# Patient Record
Sex: Male | Born: 1965
Health system: Southern US, Community
[De-identification: ages and names within clinical notes are randomized; demographics above are authoritative.]

## PROBLEM LIST (undated history)

## (undated) DIAGNOSIS — E785 Hyperlipidemia, unspecified: Secondary | ICD-10-CM

## (undated) DIAGNOSIS — I209 Angina pectoris, unspecified: Secondary | ICD-10-CM

## (undated) DIAGNOSIS — E876 Hypokalemia: Secondary | ICD-10-CM

## (undated) DIAGNOSIS — N183 Chronic kidney disease, stage 3 unspecified: Secondary | ICD-10-CM

## (undated) DIAGNOSIS — D563 Thalassemia minor: Secondary | ICD-10-CM

## (undated) DIAGNOSIS — I10 Essential (primary) hypertension: Secondary | ICD-10-CM

## (undated) DIAGNOSIS — G43909 Migraine, unspecified, not intractable, without status migrainosus: Secondary | ICD-10-CM

## (undated) DIAGNOSIS — N189 Chronic kidney disease, unspecified: Secondary | ICD-10-CM

## (undated) DIAGNOSIS — K219 Gastro-esophageal reflux disease without esophagitis: Secondary | ICD-10-CM

## (undated) DIAGNOSIS — N289 Disorder of kidney and ureter, unspecified: Secondary | ICD-10-CM

## (undated) DIAGNOSIS — R001 Bradycardia, unspecified: Secondary | ICD-10-CM

## (undated) DIAGNOSIS — R0789 Other chest pain: Secondary | ICD-10-CM

## (undated) DIAGNOSIS — G4730 Sleep apnea, unspecified: Secondary | ICD-10-CM

## (undated) HISTORY — DX: Hyperlipidemia, unspecified: E78.5

## (undated) HISTORY — PX: WISDOM TOOTH EXTRACTION: SHX21

## (undated) HISTORY — PX: VASECTOMY: SHX75

## (undated) HISTORY — PX: HERNIA REPAIR: SHX51

## (undated) HISTORY — DX: Chronic kidney disease, unspecified: N18.9

---

## 2004-05-28 HISTORY — PX: COLONOSCOPY: SHX174

## 2004-10-12 ENCOUNTER — Ambulatory Visit (HOSPITAL_COMMUNITY): Admission: RE | Admit: 2004-10-12 | Discharge: 2004-10-12 | Payer: Self-pay | Admitting: Internal Medicine

## 2004-10-12 ENCOUNTER — Ambulatory Visit: Payer: Self-pay | Admitting: Internal Medicine

## 2004-10-30 ENCOUNTER — Ambulatory Visit: Payer: Self-pay | Admitting: Internal Medicine

## 2004-11-03 ENCOUNTER — Ambulatory Visit (HOSPITAL_COMMUNITY): Admission: RE | Admit: 2004-11-03 | Discharge: 2004-11-03 | Payer: Self-pay | Admitting: Internal Medicine

## 2004-11-08 ENCOUNTER — Ambulatory Visit: Payer: Self-pay | Admitting: Internal Medicine

## 2007-02-21 ENCOUNTER — Ambulatory Visit (HOSPITAL_COMMUNITY): Admission: RE | Admit: 2007-02-21 | Discharge: 2007-02-21 | Payer: Self-pay | Admitting: Ophthalmology

## 2009-08-16 ENCOUNTER — Ambulatory Visit: Payer: Self-pay | Admitting: Orthopedic Surgery

## 2009-08-16 DIAGNOSIS — S63659A Sprain of metacarpophalangeal joint of unspecified finger, initial encounter: Secondary | ICD-10-CM | POA: Insufficient documentation

## 2010-06-27 NOTE — Letter (Signed)
Summary: Historic Patient File  Historic Patient File   Imported By: Elvera Maria 08/26/2009 09:56:16  _____________________________________________________________________  External Attachment:    Type:   Image     Comment:   history form

## 2010-06-27 NOTE — Assessment & Plan Note (Signed)
Summary: left thumb inury needs xr/[employer]/bsf   Vital Signs:  Patient profile:   45 year old male Height:      69 inches Weight:      178 pounds Pulse rate:   68 / minute Resp:     18 per minute  Vitals Entered By: Fuller Canada MD (August 16, 2009 3:05 PM)  Visit Type:  initial visit Referring Provider:  self Primary Provider:  Dr. Sherwood Gambler  CC:  left thumb pain.  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  DOI 08/14/09.  Meds: Benicar, Bystolic, Norvasc, Benadryl.    Allergies (verified): 1)  ! 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 Cardiovascular:  Denies chest pain, palpitations, fainting, and murmurs. Respiratory:  Denies short of breath, wheezing, couch, tightness, pain on inspiration, and snoring . Gastrointestinal:  Denies heartburn, nausea, vomiting, diarrhea, constipation, and blood in your stools. Genitourinary:  Denies frequency, urgency, difficulty urinating, painful urination, flank pain, and bleeding in urine. Neurologic:  Denies numbness, tingling, unsteady gait, dizziness, tremors, and seizure. Musculoskeletal:  Denies joint pain, swelling, instability, stiffness, redness, heat, and muscle pain. Endocrine:  Denies excessive thirst, exessive urination, and heat or cold intolerance. Psychiatric:  Denies nervousness, depression, anxiety, and hallucinations. Skin:  Denies changes in the skin, poor healing, rash, itching, and redness. HEENT:  Denies blurred or  double vision, eye pain, redness, and watering. Immunology:  Denies seasonal allergies, sinus problems, and allergic to bee stings; allergic to onions. Hemoatologic:  Denies easy bleeding and brusing.  Physical Exam  Additional Exam:  well dressed well groomed with vitals stable  Normal temperature of the digit swelling over the metacarpophalangeal joint firm endpoint on valgus stress in flexion and extension skin is intact normal sensation and perfusion to the hand.  His range of motion is somewhat limited but nearly full.  No weakness.     Impression & Recommendations:  Problem # 1:  SPRAIN AND STRAIN OF METACARPOPHALANGEAL OF HAND (ICD-842.12) Assessment New  AP lateral oblique LEFT hand  No fracture dislocation seen mild soft tissue swelling around the thumb  Impression soft tissue swelling consistent with a sprain no acute fracture  Orders: New Patient Level II (16109) Hand x-ray, minimum 3 views (73130)  Patient Instructions: 1)  wear brace for 3 weeks  2)  recheck in 3 weeks

## 2010-10-13 NOTE — Op Note (Signed)
Devin Wilson, Devin Wilson               ACCOUNT NO.:  [id number]   MEDICAL RECORD NO.:  [id number]          PATIENT TYPE:  AMB   LOCATION:  DAY                           FACILITY:  [facility]   PHYSICIAN:  R. Roetta Sessions, M.D. DATE OF BIRTH:  1965/10/15   DATE OF PROCEDURE:  10/12/2004  DATE OF DISCHARGE:                                 OPERATIVE REPORT   PROCEDURE:  Diagnostic esophagogastroduodenoscopy.   ENDOSCOPIST:  Jonathon Bellows, M.D.   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   She really has no chronic symptoms of gastroesophageal reflux disease or  esophageal dysphagia.  Has not really had any GI illnesses previously.  He  takes Celebrex for osteoarthritis, as well as Norvasc and Hyzaar for  hypertension.  He occasionally takes Benadryl.  He has not had any fever or  chills.  He does not recall any symptoms to go with pill impaction.  He  consumes adequate fluids when he swallows his medications, per his report.  There is no family history of chronic GI illness.  EGD is now being done to  further evaluate his symptoms.  This procedure has been discussed in regard  to length, potential risks, benefits, and alternatives have been reviewed,  and questions answered.  He is agreeable to proceed.  Please see  documentation in medical record.   PROCEDURE NOTE:  Oxygen saturation, blood pressure, pulses, and respirations  were monitored throughout the entire procedure.  Conscious sedation with  Versed 2 mg intravenously, and Demerol 50 mg intravenously in divided doses.   INSTRUMENT:  Olympus video system.   FINDINGS:  Hypopharynx appeared normal.  The upper esophageal sphincter was  easily traversed.  Examination of the tubular esophagus revealed pristine  mucosa.  The esophageal mucosa was well seen all the way down to the EG  junction.  There was no  evidence of inflammatory process.  Specifically, no  evidence of erosion, ulceration, or plaquing.  In addition, there was no  ringed appearance to the tubular esophagus, either to suggest eosinophilic  esophagitis.  The EG junction was easily traversed.  In the stomach, the  gastric cavity was empty and insufflated well.  Examination of the gastric  mucosa retroflexed view was undertaken.  The EG junction was well seen.  On  retroflexion, except for a single linear antral erosion, the gastric mucosa  appeared entirely normal.  The pylorus was patent and easily traversed.  Examination of the second portion revealed no abnormalities.   THERAPIES/DIAGNOSTIC MANEUVERS PERFORMED:  None.   DISPOSITION:  The patient tolerated the procedure well.   IMPRESSION:  1.  Normal-appearing esophagus/esophageal mucosa.  2.  Single linear antral erosion, otherwise normal stomach, normal D1, and      D2.   RECOMMENDATIONS:  1.  We will go ahead and check a gallbladder ultrasound for completion along      with LFT's, lipase, and CBC.  2.  A course of Prevacid 30 mg Solu-Tabs 1 each  morning.  3.  Carafate slurry 1 gram q.i.d. for the next 5 days.  4.  If the above labs and ultrasound are negative and he does not respond to      the above-mentioned treatment, we will consider a course of orally      administered inhalant steroid therapy for empiric treatment of      eosinophilic esophagitis.  5.  Further recommendations to follow.      RMR/MEDQ  D:  10/12/2004  T:  10/12/2004  Job:  [id number]

## 2010-10-13 NOTE — Op Note (Signed)
NAMEAPRIL, Devin Wilson               ACCOUNT NO.:  [id number]   MEDICAL RECORD NO.:  [id number]          PATIENT TYPE:  AMB   LOCATION:  DAY                           FACILITY:  [facility]   PHYSICIAN:  R. Roetta Sessions, M.D. DATE OF BIRTH:  06-22-1965   DATE OF PROCEDURE:  11/03/2004  DATE OF DISCHARGE:                                 OPERATIVE REPORT   PROCEDURES:  Diagnostic colonoscopy.   INDICATIONS FOR PROCEDURE:  The patient is a 45 year old gentleman who was  recently evaluated for odynophagia.  EGD revealed an antral erosion.  Iron  studies demonstrated a borderline iron deficiency anemia (he does have a  history of beta thalassemia).  The H&H was 11.9 and 34.8 and MCV 70.9.   Three out of three Hemoccult cards came back positive.  His odynophagia  resolved with a course of Carafate and Prevacid 30 mg orally daily.  Consequentially he comes for colonoscopy.  He is not having any lower GI  symptoms.  He has never had a colonoscopy.  There is family history of  colorectal neoplasia.  Colonoscopy is now being done.  The potential risks,  benefits and alternatives have been reviewed and questions answered.  See  documentation in the medical record.   PROCEDURE NOTE:  O2 saturation, blood pressure, pulse and respirations were  monitored throughout the entire procedure.  Conscious sedation with Versed 3  mg IV and Demerol 50 mg IV in divided doses.   INSTRUMENT:  Olympus video chip system.   FINDINGS:  The digital rectal exam revealed no abnormalities.   ENDOSCOPIC FINDINGS:  Prep was excellent.   Rectum:  Examination of the rectal mucosa, including retroflexed view of the  anal verge revealed minimal internal hemorrhoids.  Otherwise the rectal  mucosa appeared normal.   Colon:  The colonic mucosa was surveyed from the rectosigmoid junction  through the left transverse and right colon to the appendiceal orifice and  ileocecal valve.  These structures were well seen and  photographed for the  record.  The terminal ileum was intubated to 10 cm.  From this level, the  scope was slowly withdrawn.  All previously mentioned mucosal surfaces were  again seen.  The colonic mucosa appeared entirely normal.  The terminal  ileal mucosa appeared normal.  The patient tolerated the procedure well and  was reacted at endoscopy.   IMPRESSION:  Minimal internal hemorrhoids, otherwise normal rectum and  terminal ileum.   Today's findings along with the EGD are reassuring.  He is asymptomatic from  a GI standpoint.  I have asked him to continue prevacid 30 mg orally daily  for another month.  Will recheck a CBC in six weeks.       RMR/MEDQ  D:  11/03/2004  T:  11/04/2004  Job:  [id number]

## 2010-12-15 ENCOUNTER — Other Ambulatory Visit: Payer: Self-pay | Admitting: Neurology

## 2010-12-15 ENCOUNTER — Ambulatory Visit (HOSPITAL_COMMUNITY)
Admission: RE | Admit: 2010-12-15 | Discharge: 2010-12-15 | Disposition: A | Discharge status: Home / Self Care | Payer: Managed Care, Other (non HMO) | Source: Ambulatory Visit | Attending: Neurology | Admitting: Neurology

## 2010-12-15 DIAGNOSIS — M79609 Pain in unspecified limb: Secondary | ICD-10-CM | POA: Insufficient documentation

## 2010-12-15 DIAGNOSIS — R52 Pain, unspecified: Secondary | ICD-10-CM

## 2011-06-21 ENCOUNTER — Other Ambulatory Visit (HOSPITAL_COMMUNITY): Payer: Self-pay | Admitting: Family Medicine

## 2011-06-21 DIAGNOSIS — I10 Essential (primary) hypertension: Secondary | ICD-10-CM

## 2011-06-26 ENCOUNTER — Ambulatory Visit (HOSPITAL_COMMUNITY): Payer: Managed Care, Other (non HMO)

## 2011-07-13 ENCOUNTER — Ambulatory Visit (HOSPITAL_COMMUNITY): Admission: RE | Admit: 2011-07-13 | Payer: Managed Care, Other (non HMO) | Source: Ambulatory Visit

## 2011-07-13 ENCOUNTER — Ambulatory Visit (HOSPITAL_COMMUNITY): Payer: Managed Care, Other (non HMO)

## 2011-08-09 ENCOUNTER — Other Ambulatory Visit: Payer: Self-pay

## 2011-08-09 ENCOUNTER — Inpatient Hospital Stay (HOSPITAL_COMMUNITY)
Admission: EM | Admit: 2011-08-09 | Discharge: 2011-08-11 | DRG: 287 | Disposition: A | Discharge status: Home / Self Care | Payer: Managed Care, Other (non HMO) | Attending: Internal Medicine | Admitting: Internal Medicine

## 2011-08-09 DIAGNOSIS — D569 Thalassemia, unspecified: Secondary | ICD-10-CM | POA: Diagnosis present

## 2011-08-09 DIAGNOSIS — R079 Chest pain, unspecified: Principal | ICD-10-CM | POA: Diagnosis present

## 2011-08-09 DIAGNOSIS — I200 Unstable angina: Secondary | ICD-10-CM

## 2011-08-09 DIAGNOSIS — I10 Essential (primary) hypertension: Secondary | ICD-10-CM | POA: Diagnosis present

## 2011-08-09 DIAGNOSIS — E876 Hypokalemia: Secondary | ICD-10-CM | POA: Diagnosis present

## 2011-08-09 DIAGNOSIS — Z23 Encounter for immunization: Secondary | ICD-10-CM

## 2011-08-09 DIAGNOSIS — I129 Hypertensive chronic kidney disease with stage 1 through stage 4 chronic kidney disease, or unspecified chronic kidney disease: Secondary | ICD-10-CM | POA: Diagnosis present

## 2011-08-09 DIAGNOSIS — D649 Anemia, unspecified: Secondary | ICD-10-CM | POA: Diagnosis present

## 2011-08-09 DIAGNOSIS — D563 Thalassemia minor: Secondary | ICD-10-CM | POA: Insufficient documentation

## 2011-08-09 DIAGNOSIS — K219 Gastro-esophageal reflux disease without esophagitis: Secondary | ICD-10-CM | POA: Diagnosis present

## 2011-08-09 DIAGNOSIS — N189 Chronic kidney disease, unspecified: Secondary | ICD-10-CM | POA: Diagnosis present

## 2011-08-09 HISTORY — DX: Angina pectoris, unspecified: I20.9

## 2011-08-09 HISTORY — DX: Migraine, unspecified, not intractable, without status migrainosus: G43.909

## 2011-08-09 HISTORY — DX: Essential (primary) hypertension: I10

## 2011-08-09 HISTORY — DX: Gastro-esophageal reflux disease without esophagitis: K21.9

## 2011-08-09 HISTORY — DX: Bradycardia, unspecified: R00.1

## 2011-08-09 NOTE — ED Notes (Signed)
Pt with onset of aching type chest pain to left chest through to his back, states started approx 2030, no change with movement or breathing.

## 2011-08-10 ENCOUNTER — Encounter (HOSPITAL_COMMUNITY): Payer: Self-pay

## 2011-08-10 ENCOUNTER — Emergency Department (HOSPITAL_COMMUNITY): Payer: Managed Care, Other (non HMO)

## 2011-08-10 ENCOUNTER — Other Ambulatory Visit: Payer: Self-pay

## 2011-08-10 ENCOUNTER — Encounter (HOSPITAL_COMMUNITY)
Admission: EM | Disposition: A | Discharge status: Home / Self Care | Payer: Self-pay | Source: Home / Self Care | Attending: Internal Medicine

## 2011-08-10 ENCOUNTER — Ambulatory Visit (HOSPITAL_COMMUNITY): Admit: 2011-08-10 | Payer: Self-pay | Admitting: Cardiology

## 2011-08-10 DIAGNOSIS — I517 Cardiomegaly: Secondary | ICD-10-CM

## 2011-08-10 DIAGNOSIS — R079 Chest pain, unspecified: Secondary | ICD-10-CM | POA: Insufficient documentation

## 2011-08-10 DIAGNOSIS — D563 Thalassemia minor: Secondary | ICD-10-CM

## 2011-08-10 DIAGNOSIS — I200 Unstable angina: Secondary | ICD-10-CM

## 2011-08-10 DIAGNOSIS — I10 Essential (primary) hypertension: Secondary | ICD-10-CM

## 2011-08-10 HISTORY — PX: LEFT HEART CATHETERIZATION WITH CORONARY ANGIOGRAM: SHX5451

## 2011-08-10 HISTORY — PX: CARDIAC CATHETERIZATION: SHX172

## 2011-08-10 LAB — CBC
HCT: 34.6 % — ABNORMAL LOW (ref 39.0–52.0)
HCT: 36.7 % — ABNORMAL LOW (ref 39.0–52.0)
Hemoglobin: 11.1 g/dL — ABNORMAL LOW (ref 13.0–17.0)
Hemoglobin: 12.1 g/dL — ABNORMAL LOW (ref 13.0–17.0)
MCH: 23.8 pg — ABNORMAL LOW (ref 26.0–34.0)
MCV: 72.2 fL — ABNORMAL LOW (ref 78.0–100.0)
Platelets: 242 10*3/uL (ref 150–400)
RBC: 4.68 MIL/uL (ref 4.22–5.81)
RBC: 5.08 MIL/uL (ref 4.22–5.81)
RDW: 15.2 % (ref 11.5–15.5)
WBC: 4.5 10*3/uL (ref 4.0–10.5)

## 2011-08-10 LAB — BASIC METABOLIC PANEL WITH GFR
BUN: 15 mg/dL (ref 6–23)
CO2: 31 meq/L (ref 19–32)
Calcium: 9.4 mg/dL (ref 8.4–10.5)
Chloride: 100 meq/L (ref 96–112)
Creatinine, Ser: 1.48 mg/dL — ABNORMAL HIGH (ref 0.50–1.35)
GFR calc Af Amer: 64 mL/min — ABNORMAL LOW
GFR calc non Af Amer: 56 mL/min — ABNORMAL LOW
Glucose, Bld: 92 mg/dL (ref 70–99)
Potassium: 3.6 meq/L (ref 3.5–5.1)
Sodium: 137 meq/L (ref 135–145)

## 2011-08-10 LAB — LIPID PANEL
Cholesterol: 181 mg/dL (ref 0–200)
Total CHOL/HDL Ratio: 3.5 RATIO
VLDL: 13 mg/dL (ref 0–40)

## 2011-08-10 LAB — PROTIME-INR
INR: 1.06 (ref 0.00–1.49)
Prothrombin Time: 14 seconds (ref 11.6–15.2)

## 2011-08-10 LAB — CARDIAC PANEL(CRET KIN+CKTOT+MB+TROPI)
CK, MB: 5.1 ng/mL — ABNORMAL HIGH (ref 0.3–4.0)
CK, MB: 3.7 ng/mL (ref 0.3–4.0)
Total CK: 476 U/L — ABNORMAL HIGH (ref 7–232)
Troponin I: <0.3 ng/mL (ref <0.30)

## 2011-08-10 LAB — CREATININE, SERUM
GFR calc Af Amer: 77 mL/min — ABNORMAL LOW (ref >90)
GFR calc non Af Amer: 66 mL/min — ABNORMAL LOW (ref >90)

## 2011-08-10 LAB — POCT I-STAT, CHEM 8
Hemoglobin: 13.3 g/dL (ref 13.0–17.0)
Potassium: 3 mEq/L — ABNORMAL LOW (ref 3.5–5.1)
TCO2: 31 mmol/L (ref 0–100)

## 2011-08-10 LAB — POCT I-STAT TROPONIN I: Troponin i, poc: 0 ng/mL (ref 0.00–0.08)

## 2011-08-10 LAB — HEPARIN LEVEL (UNFRACTIONATED): Heparin Unfractionated: 0.95 IU/mL — ABNORMAL HIGH (ref 0.30–0.70)

## 2011-08-10 LAB — MRSA PCR SCREENING: MRSA by PCR: NEGATIVE

## 2011-08-10 SURGERY — LEFT HEART CATHETERIZATION WITH CORONARY ANGIOGRAM
Anesthesia: LOCAL

## 2011-08-10 MED ORDER — POTASSIUM CHLORIDE CRYS ER 20 MEQ PO TBCR
60.0000 meq | EXTENDED_RELEASE_TABLET | Freq: Once | ORAL | Status: AC
  Administered 2011-08-10: 60 meq via ORAL
  Filled 2011-08-10: qty 3

## 2011-08-10 MED ORDER — VERAPAMIL HCL 2.5 MG/ML IV SOLN
INTRAVENOUS | Status: AC
  Filled 2011-08-10: qty 2

## 2011-08-10 MED ORDER — HEPARIN SODIUM (PORCINE) 5000 UNIT/ML IJ SOLN
60.0000 [IU]/kg | Freq: Three times a day (TID) | INTRAMUSCULAR | Status: DC
  Administered 2011-08-10: 4750 [IU] via INTRAVENOUS
  Filled 2011-08-10 (×6): qty 0.95

## 2011-08-10 MED ORDER — FENTANYL CITRATE 0.05 MG/ML IJ SOLN
INTRAMUSCULAR | Status: AC
  Filled 2011-08-10: qty 2

## 2011-08-10 MED ORDER — NITROGLYCERIN 0.4 MG SL SUBL
0.4000 mg | SUBLINGUAL_TABLET | SUBLINGUAL | Status: DC | PRN
  Administered 2011-08-10 (×2): 0.4 mg via SUBLINGUAL
  Filled 2011-08-10: qty 25

## 2011-08-10 MED ORDER — ACETAMINOPHEN 325 MG PO TABS: 650.0000 mg | ORAL_TABLET | ORAL | Status: DC | PRN

## 2011-08-10 MED ORDER — SODIUM CHLORIDE 0.9 % IV SOLN: INTRAVENOUS | Status: AC

## 2011-08-10 MED ORDER — ONDANSETRON HCL 4 MG/2ML IJ SOLN: 4.0000 mg | Freq: Four times a day (QID) | INTRAMUSCULAR | Status: DC | PRN

## 2011-08-10 MED ORDER — SODIUM CHLORIDE 0.9 % IV SOLN
1000.0000 mL | Freq: Once | INTRAVENOUS | Status: AC
  Administered 2011-08-10: 1000 mL via INTRAVENOUS

## 2011-08-10 MED ORDER — CLONIDINE HCL 0.1 MG/24HR TD PTWK: 0.1000 mg | MEDICATED_PATCH | TRANSDERMAL | Status: DC

## 2011-08-10 MED ORDER — BIOTENE DRY MOUTH MT LIQD: 15.0000 mL | OROMUCOSAL | Status: DC | PRN

## 2011-08-10 MED ORDER — SODIUM CHLORIDE 0.9 % IV SOLN
INTRAVENOUS | Status: AC
  Administered 2011-08-10: 06:00:00 via INTRAVENOUS

## 2011-08-10 MED ORDER — HEPARIN SODIUM (PORCINE) 5000 UNIT/ML IJ SOLN
5000.0000 [IU] | Freq: Three times a day (TID) | INTRAMUSCULAR | Status: DC
  Administered 2011-08-10 – 2011-08-11 (×2): 5000 [IU] via SUBCUTANEOUS
  Filled 2011-08-10 (×5): qty 1

## 2011-08-10 MED ORDER — MORPHINE SULFATE 2 MG/ML IJ SOLN
2.0000 mg | Freq: Once | INTRAMUSCULAR | Status: AC
  Administered 2011-08-10: 2 mg via INTRAVENOUS
  Filled 2011-08-10: qty 1

## 2011-08-10 MED ORDER — HEPARIN SODIUM (PORCINE) 1000 UNIT/ML IJ SOLN
INTRAMUSCULAR | Status: AC
  Filled 2011-08-10: qty 1

## 2011-08-10 MED ORDER — LIDOCAINE HCL (PF) 1 % IJ SOLN
INTRAMUSCULAR | Status: AC
  Filled 2011-08-10: qty 30

## 2011-08-10 MED ORDER — ASPIRIN 81 MG PO CHEW
324.0000 mg | CHEWABLE_TABLET | ORAL | Status: AC
Start: 2011-08-11 — End: 2011-08-11
  Administered 2011-08-11: 324 mg via ORAL
  Filled 2011-08-10: qty 4

## 2011-08-10 MED ORDER — ACETAMINOPHEN 650 MG RE SUPP: 650.0000 mg | Freq: Four times a day (QID) | RECTAL | Status: DC | PRN

## 2011-08-10 MED ORDER — ALBUTEROL SULFATE (5 MG/ML) 0.5% IN NEBU: 2.5000 mg | INHALATION_SOLUTION | RESPIRATORY_TRACT | Status: DC | PRN

## 2011-08-10 MED ORDER — AMLODIPINE BESYLATE 5 MG PO TABS
5.0000 mg | ORAL_TABLET | Freq: Two times a day (BID) | ORAL | Status: DC
  Administered 2011-08-10 – 2011-08-11 (×3): 5 mg via ORAL
  Filled 2011-08-10 (×6): qty 1

## 2011-08-10 MED ORDER — ASPIRIN EC 325 MG PO TBEC
325.0000 mg | DELAYED_RELEASE_TABLET | Freq: Every day | ORAL | Status: DC
  Administered 2011-08-10: 325 mg via ORAL
  Filled 2011-08-10 (×2): qty 1

## 2011-08-10 MED ORDER — NITROGLYCERIN 0.2 MG/ML ON CALL CATH LAB
INTRAVENOUS | Status: AC
  Filled 2011-08-10: qty 1

## 2011-08-10 MED ORDER — HYDROCHLOROTHIAZIDE 25 MG PO TABS
25.0000 mg | ORAL_TABLET | Freq: Every day | ORAL | Status: DC
  Administered 2011-08-10 – 2011-08-11 (×2): 25 mg via ORAL
  Filled 2011-08-10 (×4): qty 1

## 2011-08-10 MED ORDER — SODIUM CHLORIDE 0.9 % IV SOLN
1000.0000 mL | INTRAVENOUS | Status: DC
  Administered 2011-08-10: 1000 mL via INTRAVENOUS

## 2011-08-10 MED ORDER — NITROGLYCERIN IN D5W 200-5 MCG/ML-% IV SOLN: 10.0000 ug/min | INTRAVENOUS | Status: DC

## 2011-08-10 MED ORDER — IOHEXOL 350 MG/ML SOLN
80.0000 mL | Freq: Once | INTRAVENOUS | Status: AC | PRN
  Administered 2011-08-10: 80 mL via INTRAVENOUS

## 2011-08-10 MED ORDER — HEPARIN (PORCINE) IN NACL 2-0.9 UNIT/ML-% IJ SOLN
INTRAMUSCULAR | Status: AC
  Filled 2011-08-10: qty 1000

## 2011-08-10 MED ORDER — ACETAMINOPHEN 325 MG PO TABS: 650.0000 mg | ORAL_TABLET | Freq: Four times a day (QID) | ORAL | Status: DC | PRN

## 2011-08-10 MED ORDER — HEPARIN (PORCINE) IN NACL 100-0.45 UNIT/ML-% IJ SOLN
12.0000 [IU]/kg/h | INTRAMUSCULAR | Status: DC
  Administered 2011-08-10: 12 [IU]/kg/h via INTRAVENOUS
  Filled 2011-08-10: qty 250

## 2011-08-10 MED ORDER — NITROGLYCERIN IN D5W 200-5 MCG/ML-% IV SOLN
10.0000 ug/min | INTRAVENOUS | Status: DC
  Administered 2011-08-10: 10 ug/min via INTRAVENOUS
  Filled 2011-08-10: qty 250

## 2011-08-10 MED ORDER — IRBESARTAN 300 MG PO TABS
300.0000 mg | ORAL_TABLET | Freq: Every day | ORAL | Status: DC
  Administered 2011-08-10 – 2011-08-11 (×2): 300 mg via ORAL
  Filled 2011-08-10 (×4): qty 1

## 2011-08-10 MED ORDER — ONDANSETRON HCL 4 MG PO TABS: 4.0000 mg | ORAL_TABLET | Freq: Four times a day (QID) | ORAL | Status: DC | PRN

## 2011-08-10 MED ORDER — METOPROLOL TARTRATE 50 MG PO TABS
25.0000 mg | ORAL_TABLET | Freq: Once | ORAL | Status: AC
  Administered 2011-08-10: 25 mg via ORAL
  Filled 2011-08-10: qty 1

## 2011-08-10 MED ORDER — MORPHINE SULFATE 2 MG/ML IJ SOLN
2.0000 mg | INTRAMUSCULAR | Status: DC | PRN
  Administered 2011-08-10: 2 mg via INTRAVENOUS
  Filled 2011-08-10: qty 1

## 2011-08-10 MED ORDER — ASPIRIN 81 MG PO CHEW: 81.0000 mg | CHEWABLE_TABLET | Freq: Every day | ORAL | Status: DC

## 2011-08-10 MED ORDER — MIDAZOLAM HCL 2 MG/2ML IJ SOLN
INTRAMUSCULAR | Status: AC
  Filled 2011-08-10: qty 2

## 2011-08-10 MED ORDER — INFLUENZA VIRUS VACC SPLIT PF IM SUSP
0.5000 mL | INTRAMUSCULAR | Status: DC
  Filled 2011-08-10: qty 0.5

## 2011-08-10 MED ORDER — HEPARIN (PORCINE) IN NACL 100-0.45 UNIT/ML-% IJ SOLN: 850.0000 [IU]/h | INTRAMUSCULAR | Status: DC

## 2011-08-10 MED ORDER — PANTOPRAZOLE SODIUM 40 MG PO TBEC
40.0000 mg | DELAYED_RELEASE_TABLET | Freq: Every day | ORAL | Status: DC
  Administered 2011-08-11: 40 mg via ORAL
  Filled 2011-08-10: qty 1

## 2011-08-10 NOTE — Progress Notes (Signed)
ANTICOAGULATION CONSULT NOTE - Initial Consult  Pharmacy Consult for Heparin Indication: chest pain/ACS  Allergies  Allergen Reactions  . Ciprofloxacin     All flouroquinolones/Facial swelling    Patient Measurements: Height: 5\' 9"  (175.3 cm) Weight: 174 lb 9.7 oz (79.2 kg) IBW/kg (Calculated) : 70.7  Heparin Dosing Weight: 79.2 kg  Vital Signs: Temp: 97.6 F (36.4 C) (03/15 0800) Temp src: Oral (03/15 0800) BP: 106/77 mmHg (03/15 0800) Pulse Rate: 51  (03/15 0931)  Labs:  Basename 08/10/11 0749 08/10/11 0446 08/10/11 0027 08/10/11 0011  HGB -- -- 13.3 12.1*  HCT -- -- 39.0 36.7*  PLT -- -- -- 242  APTT -- -- -- 31  LABPROT -- -- -- 14.0  INR -- -- -- 1.06  HEPARINUNFRC 0.95* -- -- --  CREATININE -- -- 1.40* --  CKTOTAL -- 618* -- --  CKMB -- 5.1* -- --  TROPONINI -- <0.30 -- --   Estimated Creatinine Clearance: 66.6 ml/min (by C-G formula based on Cr of 1.4).  Medical History: Past Medical History  Diagnosis Date  . Hypertension   . Thalassemia minor     Medications:  Scheduled:    . sodium chloride  1,000 mL Intravenous Once  . sodium chloride   Intravenous STAT  . amLODipine  5 mg Oral BID  . aspirin EC  325 mg Oral Daily  . cloNIDine  0.1 mg Transdermal Weekly  . hydrochlorothiazide  25 mg Oral Daily  . influenza  inactive virus vaccine  0.5 mL Intramuscular Tomorrow-1000  . irbesartan  300 mg Oral Daily  . metoprolol tartrate  25 mg Oral Once  . morphine  2 mg Intravenous Once  . morphine  2 mg Intravenous Once  . pantoprazole  40 mg Oral Q1200  . potassium chloride SA  60 mEq Oral Once  . DISCONTD: heparin  60 Units/kg Intravenous Q8H    Assessment: Okay for Protocol Heparin level above goall.  Goal of Therapy:  Heparin level 0.3-0.7 units/ml   Plan:  4750 units bolus, infusion rate 950 units/hr and Continue platelet monitoring per protocol Started in Emergency Department. Reduce Heparin Level to 850 units/hr. Repeat Level in 6  hours.  Lamonte Richer R 08/10/2011,10:10 AM

## 2011-08-10 NOTE — Progress Notes (Signed)
Please refer to the complete cardiology consultation note from today.

## 2011-08-10 NOTE — Progress Notes (Signed)
Patient ID: Devin Wilson, male   DOB: 1965-12-17, 46 y.o.   MRN: [id number]   Dr. Ival Bible note reviewed, agree with history and plan.  Will do left heart cath today with no LV-gram.  Creatinine stable at 1.48 after CTA chest last night.  He has been receiving IV fluid, which we will continue.   Marca Ancona 08/10/2011 1:49 PM

## 2011-08-10 NOTE — Progress Notes (Signed)
Patient admitted earlier this morning by Dr. Rito Ehrlich for chest pain.  Patient seen and examined, database reviewed.  He is currently on nitro and heparin infusions Initial cardiac enzymes are  Negative, will continue to follow Blood pressure control  Will add protonix to see if it helps with his symptoms.  Plans for stress test later this afternoon noted.  Will follow up.

## 2011-08-10 NOTE — Consult Note (Signed)
CARDIOLOGY CONSULT NOTE  Patient ID: TILAK OAKLEY MRN: [id number] DOB/AGE: Mar 21, 1966 46 y.o.  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 Phillips Odor who has no prior cardiac history, or prior cardiac testing, with history of hypertension. He was in his usual state of health and was working out on his elliptical, which he does 3-4 times a week. During his workout he began have left-sided chest pain, radiating to the left shoulder and arm with pain radiating into the back. He states that he slow down, hoping that the pain would go away, but it did not, so he stopped exercising. The pain lasted approximately 3 hours. He took aspirin and drank a cup of tea, rested, but pain did not subside. As a result of the discomfort. The patient was seen in the emergency room for evaluation of chest pain. On arrival, the patient's blood pressure was 156/94, heart rate 63. He was noted to be hypokalemic with potassium of 3.0. He was noted to have elevated CK of 618 with a CK-MB of 5.1. Creatinine 1.40. Troponin was found to be negative. With family history in father with a thoracic aortic aneurysm. The patient did have a CT scan, which was negative for aortic dissection or aneurysm. Chest x-ray revealed cardiomegaly without edema. He was placed on nitroglycerin drip after sublingual nitroglycerin along with heparin infusion. He was given potassium repletion of 60 mEq by mouth along with morphine. The patient now is relatively pain-free with some soreness in his left back in the scapular area. He states that he had been having some chest discomfort on and off in the past, usually associated with stress and not with exertion. The pain only lasted about a  minute and did not radiate.  Review of systems complete and found to be negative unless listed above   Past Medical History  Diagnosis Date  . Hypertension   . Thalassemia minor     Family History  Problem Relation Age of Onset  . Aneurysm Father     History   Social History  . Marital Status: Married    Spouse Name: N/A    Number of Children: N/A  . Years of Education: N/A   Occupational History  . Physician     Neurologist   Social History Main Topics  . Smoking status: Never Smoker   . Smokeless tobacco: Not on file  . Alcohol Use: 0.6 oz/week    1 Glasses of wine per week  . Drug Use: No  . Sexually Active:    Other Topics Concern  . Not on file   Social History Narrative  . No narrative on file    History reviewed. No pertinent past surgical history.   Prescriptions prior to admission  Medication Sig Dispense Refill  . amLODipine (NORVASC) 5 MG tablet Take 5 mg by mouth 2 (two) times daily.      . cholecalciferol (VITAMIN D) 1000 UNITS tablet Take 2,000 Units by mouth daily.      . cloNIDine (CATAPRES - DOSED IN MG/24 HR) 0.1 mg/24hr patch Place 1 patch onto the skin once a week.      . hydrochlorothiazide (HYDRODIURIL) 25 MG tablet Take 25 mg by mouth daily.      . Multiple Vitamin (MULTIVITAMIN) capsule Take 1 capsule  by mouth daily.      . nebivolol (BYSTOLIC) 10 MG tablet Take 10 mg by mouth 2 times daily at 12 noon and 4 pm.      . olmesartan (BENICAR) 40 MG tablet Take 40 mg by mouth daily.      Marland Kitchen omeprazole (PRILOSEC) 20 MG capsule Take 20 mg by mouth as needed.        Physical Exam: Blood pressure 106/77, pulse 49, temperature 97.6 F (36.4 C), temperature source Oral, resp. rate 17, height 5\' 9"  (1.753 m), weight 174 lb 9.7 oz (79.2 kg), SpO2 100.00%.   General: Well developed, well nourished, in no acute distress Head: Eyes PERRLA, No xanthomas.   Normal cephalic and atramatic  Lungs: Clear bilaterally to auscultation and percussion. Heart:  HRRR S1 S2, without MRG.  Pulses are 2+ & equal.            No carotid bruit. No JVD.  No abdominal bruits. No femoral bruits. Abdomen: Bowel sounds are positive, abdomen soft and non-tender without masses or                  Hernia's noted. Msk:  Back normal, normal gait. Normal strength and tone for age.Soreness in the left lateral back just below the Scapular region. Some worsening with movement. Extremities: No clubbing, cyanosis or edema.  DP +1 Neuro: Alert and oriented X 3. Psych:  Good affect, responds appropriately   Labs:   Lab Results  Component Value Date   WBC 5.3 08/10/2011   HGB 13.3 08/10/2011   HCT 39.0 08/10/2011   MCV 72.2* 08/10/2011   PLT 242 08/10/2011     Lab 08/10/11 0027  NA 141  K 3.0*  CL 100  CO2 --  BUN 17  CREATININE 1.40*  CALCIUM --  PROT --  BILITOT --  ALKPHOS --  ALT --  AST --  GLUCOSE 98   Lab Results  Component Value Date   CKTOTAL 618* 08/10/2011   CKMB 5.1* 08/10/2011   TROPONINI <0.30 08/10/2011    Lab Results  Component Value Date   CHOL 181 08/10/2011   Lab Results  Component Value Date   HDL 52 08/10/2011   Lab Results  Component Value Date   LDLCALC 116* 08/10/2011   Lab Results  Component Value Date   TRIG 67 08/10/2011   Lab Results  Component Value Date   CHOLHDL 3.5 08/10/2011   No results found for this basename: LDLDIRECT      Radiology:  EKG:NSR sinus bradycardia. Rate 46 bpm.  ASSESSMENT AND PLAN:   1. Chest Pain: : Atypical presentation and with negative cardiac enzymes, but elevated CK. This appears to be more musculoskeletal. However, not all cardiac enzymes are back. He remains on heparin drip and nitroglycerin drip with pain essentially eliminated with the exception of some lower back pain on the left lateral side, some pain with movement, but not severe. We will continue to cycle cardiac, enzymes continue aspirin. We will plan for a late afternoon. Stress Myoview. If the cardiac enzymes proved to be  positive, will then plan for transfer to [facility] hospital, where he will undergo cardiac catheterization.  2. Hypertension: Blood pressure was initially elevated on admission. It is now better controlled on nitroglycerin drip along with continuation of clonidine patch, Avapro 300 mg, and HCTZ 25 mg. It is noted that he is bradycardic, which may be related to the clonidine. We will try and wean nitroglycerin to evaluate blood pressure  and return of chest discomfort. Remain on heparin until we are certain about cardiac enzyme results. His son be hypokalemic on admission, which may have been related to the HCTZ and is now been repleted. We will check the meds a.m.  Signed: Bettey Mare. Lyman Bishop NP [facility] 08/10/2011, 9:03 AM Co-Sign MD   Attending note:  Patient seen and examined. Reviewed available records and database as recorded above by Ms. Lawrence. 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 He is admitted to the hospital with recent onset chest discomfort. He reports having brief episodes of chest pain in the past, however yesterday when exercising on his elliptical machine, he developed a more nagging left-sided chest discomfort, radiating into the back, did not resolve after stopping exercise, or after aspirin and a cup of tea. After approximally 3 hours, he presented to the ER, was given nitroglycerin, and states that this did improve his symptoms. He has been on intravenous nitroglycerin and heparin with normal troponin levels so far, although total CK was elevated at 618, MB 5.1.  Family history includes father with aortic aneurysm. A chest CT angiogram was obtained during this admission revealing no aortic dissection or aneurysm, no definite acute process within the chest, abdomen, or pelvis. Heart size was described as mildly enlarged without effusion. There was also description of minimal  atherosclerotic calcification of the left common iliac artery.  On examination now he reports intermittent mild left-sided chest discomfort, also left posterior thoracic discomfort. He is afebrile, heart rate in the 40s to 50s in sinus bradycardia, blood pressure 106/77, saturation is 100% on 2 L nasal cannula. Examination of the neck shows no elevated JVP or carotid bruits, lungs are clear, cardiac exam with regular rate and rhythm, no pericardial rub or S3 gallop, no significant systolic murmur. No peripheral edema noted. Distal pulses are full.  Lab work reviewed finding potassium 3.0, BUN 17, creatinine 1.4, total CK 618, CK-MB 5.1, troponin I less than 0.30, LDL 116, hemoglobin 13.3, hematocrit 39, d-dimer 0.23, INR 1.0. Recent ECG is reviewed showing sinus bradycardia with upward coving ST elevation in the anterior lateral leads, approximately 1/2 mm. There are no old tracings for comparison at this time.  I discussed the situation with the patient and his wife in detail. His ECG changes may be repolarization abnormalities, although I do not have an old comparison tracing at this time. The elevated total CK could be musculoskeletal, as at least his initial troponin levels were normal. He had no evidence of aortic aneurysm or dissection by recent CTA. His history of hypertension is long-standing and has required multiple medications. He states that blood pressure control has been more difficult within the last few months. Concern is that he has continued to have intermittent chest discomfort despite treatment with nitroglycerin and heparin, and therefore unstable angina remains high on the differential. After reviewing the risks and benefits, plan is to have him transferred to [facility] for a diagnostic cardiac catheterization today, to best clarify his coronary anatomy and determine if any revascularization options are necessary. He and his wife are in agreement. I have spoken with our service  representative at [facility] today Annabelle Harman), and the patient is being accepted in transfer by Dr. Graciela Husbands for procedure today. Of note, he was given potassium supplementation last night, total of 60 mEq. Creatinine is 1.4, however he is currently on normal saline at 75 cc an hour  for hydration.  Jonelle Sidle, M.D., F.A.C.C.

## 2011-08-10 NOTE — H&P (Signed)
Devin Wilson is an 46 y.o. male.    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 He does elliptical is about 3 times a week. Last night after he had been on, the elliptical for about 40 minutes he started developing pain in the left side of his chest radiating to the back. The pain also radiated down his arm a few times. The pain was about 6-7/10 in intensity. It was a dull ache. The patient stopped exercising. However, the pain did not go way. He took 2 aspirins. Denies any shortness of breath, dizziness, nausea, vomiting, diaphoresis. After about 3 hours of experiencing pain patient decided to come in to the hospital. He was given nitroglycerin with which he had some relief of pain. Was started on nitroglycerin infusion, with which the pain is come down to about 1-2/10 in intensity. He, says he's had similar pains in the past. However, they always used to last only for a few seconds. He's never had any cardiac evaluation done for his pain in the form of a stress test or cardiac catherization. His cholesterol according to the patient has always been well controlled. His blood pressure however, has been difficult to control and he is requiring multiple antihypertensive agents. He denies any fever or chills. Denies any cough. There was no relationship of the pain to deep breathing. He does get acid reflux once in a while, associated with caffeine intake and takes Prilosec on an as needed basis, but that hasn't troubled him recently. The pain from the acid reflux is usually in the center of his chest and abdomen and never left-sided as his chest pain was today.   Home Medications: Prior to Admission medications   Medication Sig Start  Date End Date Taking? Authorizing Provider  amLODipine (NORVASC) 5 MG tablet Take 5 mg by mouth 2 (two) times daily.   Yes Historical Provider, MD  cholecalciferol (VITAMIN D) 1000 UNITS tablet Take 2,000 Units by mouth daily.   Yes Historical Provider, MD  cloNIDine (CATAPRES - DOSED IN MG/24 HR) 0.1 mg/24hr patch Place 1 patch onto the skin once a week.   Yes Historical Provider, MD  hydrochlorothiazide (HYDRODIURIL) 25 MG tablet Take 25 mg by mouth daily.   Yes Historical Provider, MD  Multiple Vitamin (MULTIVITAMIN) capsule Take 1 capsule by mouth daily.   Yes Historical Provider, MD  nebivolol (BYSTOLIC) 10 MG tablet Take 10 mg by mouth 2 times daily at 12 noon and 4 pm.   Yes Historical Provider, MD  olmesartan (BENICAR) 40 MG tablet Take 40 mg by mouth daily.   Yes Historical Provider, MD  omeprazole (PRILOSEC) 20 MG capsule Take 20 mg by mouth as needed.   Yes Historical Provider, MD    Allergies:  Allergies  Allergen Reactions  . Ciprofloxacin     All flouroquinolones    Past Medical History: Past Medical History  Diagnosis Date  . Hypertension   . Thalassemia minor    There is history of some childhood surgery and a vasectomy.  Social History:  reports that he has never smoked. He does not have any smokeless tobacco history on file. He reports that he drinks about .6 ounces of  alcohol per week. He reports that he does not use illicit drugs.  Family History:  Family History  Problem Relation Age of Onset  . Aneurysm Father    There is history of hypertension, and diabetes in the family.  Review of Systems - General ROS: negative Psychological ROS: negative Ophthalmic ROS: negative ENT ROS: negative Allergy and Immunology ROS: negative Hematological and Lymphatic ROS: negative Endocrine ROS: negative Respiratory ROS: negative Cardiovascular ROS: as in hpi Gastrointestinal ROS: negative Genito-Urinary ROS: negative Musculoskeletal ROS: negative Neurological ROS:  negative Dermatological ROS: negative  Physical Examination Blood pressure 115/69, pulse 47, temperature 97.6 F (36.4 C), temperature source Oral, resp. rate 12, height 5\' 9"  (1.753 m), weight 79.379 kg (175 lb), SpO2 100.00%.  General appearance: alert, cooperative, appears stated age and no distress Head: Normocephalic, without obvious abnormality, atraumatic Eyes: conjunctivae/corneas clear. PERRL, EOM's intact.  Throat: lips, mucosa, and tongue normal; teeth and gums normal Neck: no adenopathy, no carotid bruit, no JVD, supple, symmetrical, trachea midline and thyroid not enlarged, symmetric, no tenderness/mass/nodules Back: symmetric, no curvature. ROM normal. No CVA tenderness. Resp: clear to auscultation bilaterally Cardio: Bradycardic, regular, no murmur, click, rub or gallop GI: soft, non-tender; bowel sounds normal; no masses,  no organomegaly Extremities: extremities normal, atraumatic, no cyanosis or edema Pulses: 2+ and symmetric Skin: Skin color, texture, turgor normal. No rashes or lesions Lymph nodes: Cervical, supraclavicular nodes normal. Neurologic: Grossly normal  Laboratory Data: Results for orders placed during the hospital encounter of 08/09/11 (from the past 48 hour(s))  CBC     Status: Abnormal   Collection Time   08/10/11 12:11 AM      Component Value Range Comment   WBC 5.3  4.0 - 10.5 (K/uL)    RBC 5.08  4.22 - 5.81 (MIL/uL)    Hemoglobin 12.1 (*) 13.0 - 17.0 (g/dL)    HCT 16.1 (*) 09.6 - 52.0 (%)    MCV 72.2 (*) 78.0 - 100.0 (fL)    MCH 23.8 (*) 26.0 - 34.0 (pg)    MCHC 33.0  30.0 - 36.0 (g/dL)    RDW 04.5  40.9 - 81.1 (%)    Platelets 242  150 - 400 (K/uL)   PROTIME-INR     Status: Normal   Collection Time   08/10/11 12:11 AM      Component Value Range Comment   Prothrombin Time 14.0  11.6 - 15.2 (seconds)    INR 1.06  0.00 - 1.49    APTT     Status: Normal   Collection Time   08/10/11 12:11 AM      Component Value Range Comment   aPTT 31  24  - 37 (seconds)   D-DIMER, QUANTITATIVE     Status: Normal   Collection Time   08/10/11 12:11 AM      Component Value Range Comment   D-Dimer, Quant 0.23  0.00 - 0.48 (ug/mL-FEU)   POCT I-STAT TROPONIN I     Status: Normal   Collection Time   08/10/11 12:16 AM      Component Value Range Comment   Troponin i, poc 0.00  0.00 - 0.08 (ng/mL)    Comment 3            POCT I-STAT, CHEM 8     Status: Abnormal   Collection Time   08/10/11 12:27 AM      Component Value Range Comment   Sodium 141  135 - 145 (mEq/L)    Potassium 3.0 (*) 3.5 -  5.1 (mEq/L)    Chloride 100  96 - 112 (mEq/L)    BUN 17  6 - 23 (mg/dL)    Creatinine, Ser 1.61 (*) 0.50 - 1.35 (mg/dL)    Glucose, Bld 98  70 - 99 (mg/dL)    Calcium, Ion 0.96  1.12 - 1.32 (mmol/L)    TCO2 31  0 - 100 (mmol/L)    Hemoglobin 13.3  13.0 - 17.0 (g/dL)    HCT 04.5  40.9 - 81.1 (%)   POCT I-STAT TROPONIN I     Status: Normal   Collection Time   08/10/11  3:26 AM      Component Value Range Comment   Troponin i, poc 0.00  0.00 - 0.08 (ng/mL)    Comment 3              Radiology Reports: Ct Angio Chest W/cm &/or Wo Cm  08/10/2011  *RADIOLOGY REPORT*  Clinical Data: Left-sided chest pain  CT ANGIOGRAPHY CHEST,CT ANGIOGRAPHY ABDOMEN,CT ANGIOGRAPHY OF PELVIS  Technique:  Multidetector CT imaging of the chest using the standard protocol during bolus administration of intravenous contrast. Multiplanar reconstructed images including MIPs were obtained and reviewed to evaluate the vascular anatomy.,Technique: Mul,evaluate the vascular anatomy.  Contrast: 80mL OMNIPAQUE IOHEXOL 350 MG/ML IV SOLN  Comparison: 08/10/2011 chest radiograph  Findings:  Vasculature: Noncontrast images demonstrate no aneurysmal dilatation or intramural hematoma.  Following contrast administration, normal caliber great vessels and aorta.  No dissection.  Normal celiac axis, SMA, renal arteries, and IMA. Minimal atherosclerotic calcification of the left common iliac artery.   Otherwise, unremarkable common iliac arteries, internal and external iliac arteries proximally.  Chest: While not optimized evaluate the pulmonary arterial branches, no filling defect is identified.  The heart is mildly enlarged.  No pericardial effusion.  No pleural effusion.  Minimal dependent atelectasis.  Otherwise, the lungs are clear.  No pneumothorax.  No intrathoracic lymphadenopathy.  Abdomen pelvis:  Organ evaluation is limited in the arterial phase. Within this limitation, unremarkable liver, biliary system, spleen, pancreas, adrenal glands.  Pancreas divisum is incidentally noted.  Symmetric renal enhancement.  No hydronephrosis or hydroureter. Subcentimeter hypodensity within the upper pole of each kidney is too small to further characterize however statistically most likely to represent cysts.  No free intraperitoneal air or fluid.  No bowel obstruction. Normal appendix.  The lower pelvis is not included on the examination.  As this is an angio examination, the study is still of diagnostic quality.  There is a small fat containing umbilical hernia. No lymphadenopathy.  No acute osseous abnormality.  IMPRESSION: No aortic dissection or aneurysm.  No acute process identified within the chest abdomen pelvis.  The heart size is upper normal limits to mildly enlarged.  Original Report Authenticated By: Waneta Martins, M.D.   Ct Angio Pelvis W/cm &/or Wo/cm  08/10/2011  *RADIOLOGY REPORT*  Clinical Data: Left-sided chest pain  CT ANGIOGRAPHY CHEST,CT ANGIOGRAPHY ABDOMEN,CT ANGIOGRAPHY OF PELVIS  Technique:  Multidetector CT imaging of the chest using the standard protocol during bolus administration of intravenous contrast. Multiplanar reconstructed images including MIPs were obtained and reviewed to evaluate the vascular anatomy.,Technique: Mul,evaluate the vascular anatomy.  Contrast: 80mL OMNIPAQUE IOHEXOL 350 MG/ML IV SOLN  Comparison: 08/10/2011 chest radiograph  Findings:  Vasculature:  Noncontrast images demonstrate no aneurysmal dilatation or intramural hematoma.  Following contrast administration, normal caliber great vessels and aorta.  No dissection.  Normal celiac axis, SMA, renal arteries, and IMA. Minimal atherosclerotic calcification of the left common  iliac artery.  Otherwise, unremarkable common iliac arteries, internal and external iliac arteries proximally.  Chest: While not optimized evaluate the pulmonary arterial branches, no filling defect is identified.  The heart is mildly enlarged.  No pericardial effusion.  No pleural effusion.  Minimal dependent atelectasis.  Otherwise, the lungs are clear.  No pneumothorax.  No intrathoracic lymphadenopathy.  Abdomen pelvis:  Organ evaluation is limited in the arterial phase. Within this limitation, unremarkable liver, biliary system, spleen, pancreas, adrenal glands.  Pancreas divisum is incidentally noted.  Symmetric renal enhancement.  No hydronephrosis or hydroureter. Subcentimeter hypodensity within the upper pole of each kidney is too small to further characterize however statistically most likely to represent cysts.  No free intraperitoneal air or fluid.  No bowel obstruction. Normal appendix.  The lower pelvis is not included on the examination.  As this is an angio examination, the study is still of diagnostic quality.  There is a small fat containing umbilical hernia. No lymphadenopathy.  No acute osseous abnormality.  IMPRESSION: No aortic dissection or aneurysm.  No acute process identified within the chest abdomen pelvis.  The heart size is upper normal limits to mildly enlarged.  Original Report Authenticated By: Waneta Martins, M.D.   Ct Angio Abdomen W/cm &/or Wo Contrast  08/10/2011  *RADIOLOGY REPORT*  Clinical Data: Left-sided chest pain  CT ANGIOGRAPHY CHEST,CT ANGIOGRAPHY ABDOMEN,CT ANGIOGRAPHY OF PELVIS  Technique:  Multidetector CT imaging of the chest using the standard protocol during bolus administration of  intravenous contrast. Multiplanar reconstructed images including MIPs were obtained and reviewed to evaluate the vascular anatomy.,Technique: Mul,evaluate the vascular anatomy.  Contrast: 80mL OMNIPAQUE IOHEXOL 350 MG/ML IV SOLN  Comparison: 08/10/2011 chest radiograph  Findings:  Vasculature: Noncontrast images demonstrate no aneurysmal dilatation or intramural hematoma.  Following contrast administration, normal caliber great vessels and aorta.  No dissection.  Normal celiac axis, SMA, renal arteries, and IMA. Minimal atherosclerotic calcification of the left common iliac artery.  Otherwise, unremarkable common iliac arteries, internal and external iliac arteries proximally.  Chest: While not optimized evaluate the pulmonary arterial branches, no filling defect is identified.  The heart is mildly enlarged.  No pericardial effusion.  No pleural effusion.  Minimal dependent atelectasis.  Otherwise, the lungs are clear.  No pneumothorax.  No intrathoracic lymphadenopathy.  Abdomen pelvis:  Organ evaluation is limited in the arterial phase. Within this limitation, unremarkable liver, biliary system, spleen, pancreas, adrenal glands.  Pancreas divisum is incidentally noted.  Symmetric renal enhancement.  No hydronephrosis or hydroureter. Subcentimeter hypodensity within the upper pole of each kidney is too small to further characterize however statistically most likely to represent cysts.  No free intraperitoneal air or fluid.  No bowel obstruction. Normal appendix.  The lower pelvis is not included on the examination.  As this is an angio examination, the study is still of diagnostic quality.  There is a small fat containing umbilical hernia. No lymphadenopathy.  No acute osseous abnormality.  IMPRESSION: No aortic dissection or aneurysm.  No acute process identified within the chest abdomen pelvis.  The heart size is upper normal limits to mildly enlarged.  Original Report Authenticated By: Waneta Martins, M.D.     Dg Chest Portable 1 View  08/10/2011  *RADIOLOGY REPORT*  Clinical Data: Chest pain  PORTABLE CHEST - 1 VIEW  Comparison: None.  Findings: Heart is mildly enlarged.  Clear lungs.  Normal vascularity.  IMPRESSION: Cardiomegaly without edema.  Original Report Authenticated By: Donavan Burnet, M.D.  Electrocardiogram: EKG shows sinus bradycardia at 46 beats per minute with normal axis. Intervals appear to be in the normal range. RBBB is noted. No acute ST or T-wave changes are seen. Patient had 2 EKGs over the course of about 3-4 hours and they did not show any dynamic changes.  Assessment/Plan  Principal Problem:  *Chest pain on exertion Active Problems:  HTN (hypertension)   #1 chest pain on exertion: The patient's pain started off being typical however, now it's no longer typical for coronary artery disease. His EKG does not show any acute ischemic changes. Differential diagnoses include pain due to acid reflux. Cardiac disease cannot be entirely ruled out. Due to his history of a poorly controlled hypertension, he may warrant noninvasive testing. We will keep the patient on aspirin. Continue the heparin and the nitroglycerin infusion for now. If another set of cardiac enzymes are negative, heparin may be discontinued. We will go ahead and consult cardiology to take a look at this patient. Lipid panel will be checked.  #2 hypertension: Continue with his antihypertensive regimen. Will hold off on the Bystolic for now due to his bradycardia.  #3 hypokalemia: This has been repleted by the ED physician. Recheck the levels in the morning.  #4 mild anemia: This may require outpatient workup.  #5 mildly elevated creatinine: Continue to monitor.  He is a full code. DVT, prophylaxis is already on heparin infusion.  Further management decisions will depend on results of further testing and patient's response to treatment.  [facility]  [facility] Pager  [phone]  08/10/2011, 4:43 AM

## 2011-08-10 NOTE — ED Provider Notes (Addendum)
History     CSN: [id number]  Arrival date & time 08/09/11  2354   First MD Initiated Contact with Patient 08/10/11 0009      Chief Complaint  Patient presents with  . Chest Pain    (Consider location/radiation/quality/duration/timing/severity/associated sxs/prior treatment) HPI  Eros A Goldsborough IS A 46 y.o. male  With a history of hypertension brought in by ambulance to the Emergency Department complaining of chest pain. 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 Patient states the pain became fairly intense and he stopped training. Pain did not seem to get any better. He took 2 regular aspirin. He drank cup of tea. Neither made a difference. He continues to have left-sided chest pain that radiates to his back. Is no change to the pain with deep breathing or movement. The pain is rather intense this 6 or 7/10. It is not associated with nausea, vomiting, diaphoresis, shortness of breath. Patient is a physician, neurologist. Family history is significant for a father who had a thoracic aneurysm at the age of 2. He died at the age of 47 of complications of the aneurysm.  PCP Dr. Sherwood Gambler  Past Medical History  Diagnosis Date  . Hypertension     History reviewed. No pertinent past surgical history.  No family history on file.  History  Substance Use Topics  . Smoking status: Never Smoker   . Smokeless tobacco: Not on file  . Alcohol Use: Yes      Review of Systems ROS: Statement: All systems negative except as marked or noted in the HPI; Constitutional: Negative for fever and chills. ; ; Eyes: Negative for eye pain, redness and discharge. ; ; ENMT: Negative for ear pain, hoarseness, nasal congestion, sinus pressure and sore throat. ; ; Cardiovascular: Negative for  palpitations, diaphoresis, dyspnea and peripheral edema. ; ; Respiratory: Negative for cough, wheezing and stridor. ; ; Gastrointestinal: Negative for nausea, vomiting, diarrhea, abdominal pain, blood in  stool, hematemesis, jaundice and rectal bleeding. . ; ; Genitourinary: Negative for dysuria, flank pain and hematuria. ; ; Musculoskeletal: Negative for back pain and neck pain. Negative for swelling and trauma.; ; Skin: Negative for pruritus, rash, abrasions, blisters, bruising and skin lesion.; ; Neuro: Negative for headache, lightheadedness and neck stiffness. Negative for weakness, altered level of consciousness , altered mental status, extremity weakness, paresthesias, involuntary movement, seizure and syncope.     Allergies  Ciprofloxacin  Home Medications   Current Outpatient Rx  Name Route Sig Dispense Refill  . AMLODIPINE BESYLATE 5 MG PO TABS Oral Take 5 mg by mouth 2 (two) times daily.    Marland Kitchen VITAMIN D 1000 UNITS PO TABS Oral Take 2,000 Units by mouth daily.    Marland Kitchen HYDROCHLOROTHIAZIDE 25 MG PO TABS Oral Take 25 mg by mouth daily.    . MULTIVITAMINS PO CAPS Oral Take 1 capsule by mouth daily.    . NEBIVOLOL HCL 10 MG PO TABS Oral Take 10 mg by mouth 2 times daily at 12 noon and 4 pm.    . OLMESARTAN MEDOXOMIL 40 MG PO TABS Oral Take 40 mg by mouth daily.      BP 138/92  Pulse 65  Temp(Src) 97.8 F (36.6 C) (Oral)  Resp 18  Ht 5\' 9"  (1.753 m)  Wt 175 lb (79.379 kg)  BMI 25.84 kg/m2  SpO2 98%  Physical Exam Physical examination:  Nursing notes reviewed; Vital signs and O2 SAT reviewed;  Constitutional: Well developed, Well nourished, Well hydrated,  In no acute distress; Head:  Normocephalic, atraumatic; Eyes: EOMI, PERRL, No scleral icterus; ENMT: Mouth and pharynx normal, Mucous membranes moist; Neck: Supple, Full range of motion, No lymphadenopathy; Cardiovascular: Regular rate and rhythm, No murmur, rub, or gallop; Respiratory: Breath sounds clear & equal bilaterally, No rales, rhonchi, wheezes, or rub, Normal respiratory effort/excursion; Chest: Nontender, Movement normal; Abdomen: Soft, Nontender, Nondistended, Normal bowel sounds; Genitourinary: No CVA tenderness; Extremities:  Pulses normal, No tenderness, No edema, No calf edema or asymmetry.; Neuro: AA&Ox3, Major CN grossly intact.  No gross focal motor or sensory deficits in extremities.; Skin: Color normal, Warm, Dry  ED Course  Procedures (including critical care time) Results for orders placed during the hospital encounter of 08/09/11  CBC      Component Value Range   WBC 5.3  4.0 - 10.5 (K/uL)   RBC 5.08  4.22 - 5.81 (MIL/uL)   Hemoglobin 12.1 (*) 13.0 - 17.0 (g/dL)   HCT 16.1 (*) 09.6 - 52.0 (%)   MCV 72.2 (*) 78.0 - 100.0 (fL)   MCH 23.8 (*) 26.0 - 34.0 (pg)   MCHC 33.0  30.0 - 36.0 (g/dL)   RDW 04.5  40.9 - 81.1 (%)   Platelets 242  150 - 400 (K/uL)  PROTIME-INR      Component Value Range   Prothrombin Time 14.0  11.6 - 15.2 (seconds)   INR 1.06  0.00 - 1.49   APTT      Component Value Range   aPTT 31  24 - 37 (seconds)  D-DIMER, QUANTITATIVE      Component Value Range   D-Dimer, Quant 0.23  0.00 - 0.48 (ug/mL-FEU)  POCT I-STAT TROPONIN I      Component Value Range   Troponin i, poc 0.00  0.00 - 0.08 (ng/mL)   Comment 3           POCT I-STAT, CHEM 8      Component Value Range   Sodium 141  135 - 145 (mEq/L)   Potassium 3.0 (*) 3.5 - 5.1 (mEq/L)   Chloride 100  96 - 112 (mEq/L)   BUN 17  6 - 23 (mg/dL)   Creatinine, Ser 9.14 (*) 0.50 - 1.35 (mg/dL)   Glucose, Bld 98  70 - 99 (mg/dL)   Calcium, Ion 7.82  9.56 - 1.32 (mmol/L)   TCO2 31  0 - 100 (mmol/L)   Hemoglobin 13.3  13.0 - 17.0 (g/dL)   HCT 21.3  08.6 - 57.8 (%)   #1  Date: 08/10/2011  2357  Rate: 61  Rhythm: normal sinus rhythm  QRS Axis: normal  Intervals: normal  ST/T Wave abnormalities: normal  Conduction Disutrbances:incomplete RBBB  Narrative Interpretation:   Old EKG Reviewed: none available   #2  Date: 08/10/2011  0325  Rate:46  Rhythm: sinus bradycardia  QRS Axis: normal  Intervals: normal  ST/T Wave abnormalities: normal  Conduction Disutrbances:none  Narrative Interpretation:   Old EKG Reviewed:  unchanged except rate  Ct Angio Chest W/cm &/or Wo Cm  08/10/2011  *RADIOLOGY REPORT*  Clinical Data: Left-sided chest pain  CT ANGIOGRAPHY CHEST,CT ANGIOGRAPHY ABDOMEN,CT ANGIOGRAPHY OF PELVIS  Technique:  Multidetector CT imaging of the chest using the standard protocol during bolus administration of intravenous contrast. Multiplanar reconstructed images including MIPs were obtained and reviewed to evaluate the vascular anatomy.,Technique: Mul,evaluate the vascular anatomy.  Contrast: 80mL OMNIPAQUE IOHEXOL 350 MG/ML IV SOLN  Comparison: 08/10/2011 chest radiograph  Findings:  Vasculature: Noncontrast images demonstrate no aneurysmal dilatation or  intramural hematoma.  Following contrast administration, normal caliber great vessels and aorta.  No dissection.  Normal celiac axis, SMA, renal arteries, and IMA. Minimal atherosclerotic calcification of the left common iliac artery.  Otherwise, unremarkable common iliac arteries, internal and external iliac arteries proximally.  Chest: While not optimized evaluate the pulmonary arterial branches, no filling defect is identified.  The heart is mildly enlarged.  No pericardial effusion.  No pleural effusion.  Minimal dependent atelectasis.  Otherwise, the lungs are clear.  No pneumothorax.  No intrathoracic lymphadenopathy.  Abdomen pelvis:  Organ evaluation is limited in the arterial phase. Within this limitation, unremarkable liver, biliary system, spleen, pancreas, adrenal glands.  Pancreas divisum is incidentally noted.  Symmetric renal enhancement.  No hydronephrosis or hydroureter. Subcentimeter hypodensity within the upper pole of each kidney is too small to further characterize however statistically most likely to represent cysts.  No free intraperitoneal air or fluid.  No bowel obstruction. Normal appendix.  The lower pelvis is not included on the examination.  As this is an angio examination, the study is still of diagnostic quality.  There is a small fat  containing umbilical hernia. No lymphadenopathy.  No acute osseous abnormality.  IMPRESSION: No aortic dissection or aneurysm.  No acute process identified within the chest abdomen pelvis.  The heart size is upper normal limits to mildly enlarged.  Original Report Authenticated By: Waneta Martins, M.D.   Ct Angio Pelvis W/cm &/or Wo/cm  08/10/2011  *RADIOLOGY REPORT*  Clinical Data: Left-sided chest pain  CT ANGIOGRAPHY CHEST,CT ANGIOGRAPHY ABDOMEN,CT ANGIOGRAPHY OF PELVIS  Technique:  Multidetector CT imaging of the chest using the standard protocol during bolus administration of intravenous contrast. Multiplanar reconstructed images including MIPs were obtained and reviewed to evaluate the vascular anatomy.,Technique: Mul,evaluate the vascular anatomy.  Contrast: 80mL OMNIPAQUE IOHEXOL 350 MG/ML IV SOLN  Comparison: 08/10/2011 chest radiograph  Findings:  Vasculature: Noncontrast images demonstrate no aneurysmal dilatation or intramural hematoma.  Following contrast administration, normal caliber great vessels and aorta.  No dissection.  Normal celiac axis, SMA, renal arteries, and IMA. Minimal atherosclerotic calcification of the left common iliac artery.  Otherwise, unremarkable common iliac arteries, internal and external iliac arteries proximally.  Chest: While not optimized evaluate the pulmonary arterial branches, no filling defect is identified.  The heart is mildly enlarged.  No pericardial effusion.  No pleural effusion.  Minimal dependent atelectasis.  Otherwise, the lungs are clear.  No pneumothorax.  No intrathoracic lymphadenopathy.  Abdomen pelvis:  Organ evaluation is limited in the arterial phase. Within this limitation, unremarkable liver, biliary system, spleen, pancreas, adrenal glands.  Pancreas divisum is incidentally noted.  Symmetric renal enhancement.  No hydronephrosis or hydroureter. Subcentimeter hypodensity within the upper pole of each kidney is too small to further characterize  however statistically most likely to represent cysts.  No free intraperitoneal air or fluid.  No bowel obstruction. Normal appendix.  The lower pelvis is not included on the examination.  As this is an angio examination, the study is still of diagnostic quality.  There is a small fat containing umbilical hernia. No lymphadenopathy.  No acute osseous abnormality.  IMPRESSION: No aortic dissection or aneurysm.  No acute process identified within the chest abdomen pelvis.  The heart size is upper normal limits to mildly enlarged.  Original Report Authenticated By: Waneta Martins, M.D.   Ct Angio Abdomen W/cm &/or Wo Contrast  08/10/2011  *RADIOLOGY REPORT*  Clinical Data: Left-sided chest pain  CT ANGIOGRAPHY CHEST,CT ANGIOGRAPHY ABDOMEN,CT  ANGIOGRAPHY OF PELVIS  Technique:  Multidetector CT imaging of the chest using the standard protocol during bolus administration of intravenous contrast. Multiplanar reconstructed images including MIPs were obtained and reviewed to evaluate the vascular anatomy.,Technique: Mul,evaluate the vascular anatomy.  Contrast: 80mL OMNIPAQUE IOHEXOL 350 MG/ML IV SOLN  Comparison: 08/10/2011 chest radiograph  Findings:  Vasculature: Noncontrast images demonstrate no aneurysmal dilatation or intramural hematoma.  Following contrast administration, normal caliber great vessels and aorta.  No dissection.  Normal celiac axis, SMA, renal arteries, and IMA. Minimal atherosclerotic calcification of the left common iliac artery.  Otherwise, unremarkable common iliac arteries, internal and external iliac arteries proximally.  Chest: While not optimized evaluate the pulmonary arterial branches, no filling defect is identified.  The heart is mildly enlarged.  No pericardial effusion.  No pleural effusion.  Minimal dependent atelectasis.  Otherwise, the lungs are clear.  No pneumothorax.  No intrathoracic lymphadenopathy.  Abdomen pelvis:  Organ evaluation is limited in the arterial phase. Within  this limitation, unremarkable liver, biliary system, spleen, pancreas, adrenal glands.  Pancreas divisum is incidentally noted.  Symmetric renal enhancement.  No hydronephrosis or hydroureter. Subcentimeter hypodensity within the upper pole of each kidney is too small to further characterize however statistically most likely to represent cysts.  No free intraperitoneal air or fluid.  No bowel obstruction. Normal appendix.  The lower pelvis is not included on the examination.  As this is an angio examination, the study is still of diagnostic quality.  There is a small fat containing umbilical hernia. No lymphadenopathy.  No acute osseous abnormality.  IMPRESSION: No aortic dissection or aneurysm.  No acute process identified within the chest abdomen pelvis.  The heart size is upper normal limits to mildly enlarged.  Original Report Authenticated By: Waneta Martins, M.D.   Dg Chest Portable 1 View  08/10/2011  *RADIOLOGY REPORT*  Clinical Data: Chest pain  PORTABLE CHEST - 1 VIEW  Comparison: None.  Findings: Heart is mildly enlarged.  Clear lungs.  Normal vascularity.  IMPRESSION: Cardiomegaly without edema.  Original Report Authenticated By: Donavan Burnet, M.D.     0030 Pain went from 6/10 to 3/10 with first NTG. Pain went from 3/10 to 1/10 with second NTG. 0124 Pain now back to a 5/10. Remains on the left side with radiation to the back. 0210   T/C to Dr. Margo Aye, on call for [facility], case discussed, including:  HPI, pertinent PM/SHx, VS/PE, dx testing, ED course and treatment. If second troponin negative can admit to [facility] for rest of rule out.  0215 With h/o father having throracic aneurysm at early age, will CT to r/o.  0400 :  T/C toDr. Rito Ehrlich, case discussed, including:  HPI, pertinent PM/SHx, VS/PE, dx testing, ED course and treatment.  Agreeable to admission at [facility].  Requests to write temporary orders, step down  Bed. Temp ordered placed.   MDM  Patient with history of chest  pain that began with exertion. Given IV fluids, nitroglycerin x2, nitroglycerin and heparin drip begun, analgesic given.Chest pain improved with NTG and with start of IV NTG. He received 2 doses of morphine. Pain free. Chest x-ray was negative with the exception of mild cardiomegaly. CT angiogram of chest abdomen and pelvis revealed no acute findings. Incidentally found on CT is pancreas divisum. I have reviewed all of the lab work and x-ray results with patient and his wife. We'll make arrangements for admission here any pattern to complete the rule out and have him seen  by cardiology tomorrow. Spoke with Dr. Rito Ehrlich, hospitalist we will admit the patient.Pt stable in ED with no significant deterioration in condition.The patient appears reasonably stabilized for admission considering the current resources, flow, and capabilities available in the ED at this time, and I doubt any other [facility] requiring further screening and/or treatment in the ED prior to admission.  CRITICAL CARE Performed by: Annamarie Dawley   Total critical care time:40  Critical care time was exclusive of separately billable procedures and treating other patients.  Critical care was necessary to treat or prevent imminent or life-threatening deterioration.  Critical care was time spent personally by me on the following activities: development of treatment plan with patient and/or surrogate as well as nursing, discussions with consultants, evaluation of patient's response to treatment, examination of patient, obtaining history from patient or surrogate, ordering and performing treatments and interventions, ordering and review of laboratory studies, ordering and review of radiographic studies, pulse oximetry and re-evaluation of patient's condition.        Nicoletta Dress. Colon Branch, MD 08/10/11 1610  Nicoletta Dress. Colon Branch, MD 08/10/11 [id number]

## 2011-08-10 NOTE — Progress Notes (Signed)
  Echocardiogram 2D Echocardiogram has been performed.  Meliss Fleek, Real Cons 08/10/2011, 3:58 PM

## 2011-08-10 NOTE — Progress Notes (Signed)
Case discussed with Dr. Diona Browner  Plans to transfer patient to [facility] under cardiology service for cardiac catheterization noted.  Appreciate cardiology assistance.  Our service will be available if needed. Please let us know if we can be of any assistance.  Will sign off.

## 2011-08-10 NOTE — Progress Notes (Signed)
Pt's HR 38 -44 , asymptomatic.  Denies pain/discomfor.  J. Hope. PA informed.  No new orders.  Will cont. To monitor.  Amanda Pea, Charity fundraiser.

## 2011-08-10 NOTE — Procedures (Signed)
   Cardiac Catheterization Procedure Note  Name: Devin Wilson MRN: [id number] DOB: 13-Sep-1965  Procedure: Selective Coronary Angiography  Indication: Chest pain   Procedural Details: Allen's test was positive.  The right wrist was prepped, draped, and anesthetized with 1% lidocaine. Using the modified Seldinger technique, a 5 French sheath was introduced into the right radial artery. 3 mg of verapamil was administered through the sheath, weight-based unfractionated heparin was administered intravenously.The LAD and LCx had separate ostia.  JL3.5 was used to engage the LCx and the JL3.0 guide was used to engage the LAD.  JR4 was used for the RCA. LV gram was not done due to elevated creatinine + recent CT with contrast.  Catheter exchanges were performed over an exchange length guidewire. There were no immediate procedural complications. A TR band was used for radial hemostasis at the completion of the procedure.  The patient was transferred to the post catheterization recovery area for further monitoring.  Procedural Findings: Hemodynamics: AO 106/64  Coronary angiography: Coronary dominance: right  There were separate ostia off the left cusp for the LAD and LCx.   Left anterior descending (LAD): Large vessel originating directly from left cusp.  There were luminal irregularities but no significant stenosis.   Left circumflex (LCx): Large vessel originating directly from the left cusp.  There were luminal irregularities but no signficant stenosis.   Right coronary artery (RCA): Large dominant vessel with no significant stenosis.  Luminal irregularities only.   Left ventriculography: Not done due to elevated creatinine and contrast bolus earlier today for CT.  25 cc contrast total used for this study.   Final Conclusions:  No obstructive CAD.  Luminal irregularities only.  No explanation for chest pain.  Will get echocardiogram today, if no further problems discharge tomorrow morning.   I used minimal contrast for this study and will hydrate the patient post-cath.   Marca Ancona 08/10/2011, 2:34 PM

## 2011-08-10 NOTE — Progress Notes (Signed)
Pt transferred to [facility] via [facility] ambulance. Report given to ambulance crew.

## 2011-08-11 ENCOUNTER — Other Ambulatory Visit: Payer: Self-pay

## 2011-08-11 ENCOUNTER — Encounter (HOSPITAL_COMMUNITY): Payer: Self-pay | Admitting: Nurse Practitioner

## 2011-08-11 DIAGNOSIS — K219 Gastro-esophageal reflux disease without esophagitis: Secondary | ICD-10-CM | POA: Insufficient documentation

## 2011-08-11 DIAGNOSIS — I200 Unstable angina: Secondary | ICD-10-CM

## 2011-08-11 LAB — COMPREHENSIVE METABOLIC PANEL
ALT: 23 U/L (ref 0–53)
BUN: 12 mg/dL (ref 6–23)
Calcium: 9.4 mg/dL (ref 8.4–10.5)
Creatinine, Ser: 1.43 mg/dL — ABNORMAL HIGH (ref 0.50–1.35)
GFR calc Af Amer: 67 mL/min — ABNORMAL LOW (ref >90)
Glucose, Bld: 91 mg/dL (ref 70–99)
Sodium: 140 mEq/L (ref 135–145)
Total Protein: 6.2 g/dL (ref 6.0–8.3)

## 2011-08-11 LAB — CBC
Hemoglobin: 11.5 g/dL — ABNORMAL LOW (ref 13.0–17.0)
MCH: 23.5 pg — ABNORMAL LOW (ref 26.0–34.0)
MCHC: 31.9 g/dL (ref 30.0–36.0)

## 2011-08-11 MED ORDER — ASPIRIN 81 MG PO CHEW: 81.0000 mg | CHEWABLE_TABLET | Freq: Every day | ORAL | Status: AC

## 2011-08-11 NOTE — Progress Notes (Signed)
Patient ID: Devin Wilson, male   DOB: 1966-04-07, 46 y.o.   MRN: [id number] Subjective:  No chest pain this morning. Cath results reviewed.  Objective:  Vital Signs in the last 24 hours: Temp:  [97.3 F (36.3 C)-98 F (36.7 C)] 98 F (36.7 C) (03/16 0609) Pulse Rate:  [42-60] 57  (03/16 0609) Resp:  [16-20] 16  (03/16 0609) BP: (119-137)/(73-91) 120/78 mmHg (03/16 0609) SpO2:  [95 %-100 %] 100 % (03/16 0609) Weight:  [76.613 kg (168 lb 14.4 oz)-77.7 kg (171 lb 4.8 oz)] 76.613 kg (168 lb 14.4 oz) (03/16 0609)  Intake/Output from previous day: 03/15 0701 - 03/16 0700 In: 1980 [P.O.:480; I.V.:1500] Out: 1700 [Urine:1700] Intake/Output from this shift: Total I/O In: 120 [P.O.:120] Out: -   Physical Exam: Well appearing NAD HEENT: Unremarkable Neck:  No JVD, no thyromegally Lungs:  Clear with no wheezes. HEART:  Regular rate rhythm, no murmurs, no rubs, no clicks Abd:  Flat, positive bowel sounds, no organomegally, no rebound, no guarding Ext:  2 plus pulses, no edema, no cyanosis, no clubbing. Bandage on the wrist. Skin:  No rashes no nodules Neuro:  CN II through XII intact, motor grossly intact  Lab Results:  Basename 08/11/11 0630 08/10/11 2007  WBC 3.9* 4.5  HGB 11.5* 11.1*  PLT 258 160    Basename 08/11/11 0630 08/10/11 2007 08/10/11 1230  NA 140 -- 137  K 3.4* -- 3.6  CL 102 -- 100  CO2 31 -- 31  GLUCOSE 91 -- 92  BUN 12 -- 15  CREATININE 1.43* 1.28 --    Basename 08/10/11 1230 08/10/11 0446  TROPONINI <0.30 <0.30   Hepatic Function Panel  Basename 08/11/11 0630  PROT 6.2  ALBUMIN 3.4*  AST 24  ALT 23  ALKPHOS 75  BILITOT 0.9  BILIDIR --  IBILI --    Basename 08/10/11 0456  CHOL 181   No results found for this basename: PROTIME in the last 72 hours  Imaging: Ct Angio Chest W/cm &/or Wo Cm  08/10/2011  *RADIOLOGY REPORT*  Clinical Data: Left-sided chest pain  CT ANGIOGRAPHY CHEST,CT ANGIOGRAPHY ABDOMEN,CT ANGIOGRAPHY OF PELVIS  Technique:   Multidetector CT imaging of the chest using the standard protocol during bolus administration of intravenous contrast. Multiplanar reconstructed images including MIPs were obtained and reviewed to evaluate the vascular anatomy.,Technique: Mul,evaluate the vascular anatomy.  Contrast: 80mL OMNIPAQUE IOHEXOL 350 MG/ML IV SOLN  Comparison: 08/10/2011 chest radiograph  Findings:  Vasculature: Noncontrast images demonstrate no aneurysmal dilatation or intramural hematoma.  Following contrast administration, normal caliber great vessels and aorta.  No dissection.  Normal celiac axis, SMA, renal arteries, and IMA. Minimal atherosclerotic calcification of the left common iliac artery.  Otherwise, unremarkable common iliac arteries, internal and external iliac arteries proximally.  Chest: While not optimized evaluate the pulmonary arterial branches, no filling defect is identified.  The heart is mildly enlarged.  No pericardial effusion.  No pleural effusion.  Minimal dependent atelectasis.  Otherwise, the lungs are clear.  No pneumothorax.  No intrathoracic lymphadenopathy.  Abdomen pelvis:  Organ evaluation is limited in the arterial phase. Within this limitation, unremarkable liver, biliary system, spleen, pancreas, adrenal glands.  Pancreas divisum is incidentally noted.  Symmetric renal enhancement.  No hydronephrosis or hydroureter. Subcentimeter hypodensity within the upper pole of each kidney is too small to further characterize however statistically most likely to represent cysts.  No free intraperitoneal air or fluid.  No bowel obstruction. Normal appendix.  The lower pelvis is  not included on the examination.  As this is an angio examination, the study is still of diagnostic quality.  There is a small fat containing umbilical hernia. No lymphadenopathy.  No acute osseous abnormality.  IMPRESSION: No aortic dissection or aneurysm.  No acute process identified within the chest abdomen pelvis.  The heart size is  upper normal limits to mildly enlarged.  Original Report Authenticated By: Waneta Martins, M.D.   Ct Angio Pelvis W/cm &/or Wo/cm  08/10/2011  *RADIOLOGY REPORT*  Clinical Data: Left-sided chest pain  CT ANGIOGRAPHY CHEST,CT ANGIOGRAPHY ABDOMEN,CT ANGIOGRAPHY OF PELVIS  Technique:  Multidetector CT imaging of the chest using the standard protocol during bolus administration of intravenous contrast. Multiplanar reconstructed images including MIPs were obtained and reviewed to evaluate the vascular anatomy.,Technique: Mul,evaluate the vascular anatomy.  Contrast: 80mL OMNIPAQUE IOHEXOL 350 MG/ML IV SOLN  Comparison: 08/10/2011 chest radiograph  Findings:  Vasculature: Noncontrast images demonstrate no aneurysmal dilatation or intramural hematoma.  Following contrast administration, normal caliber great vessels and aorta.  No dissection.  Normal celiac axis, SMA, renal arteries, and IMA. Minimal atherosclerotic calcification of the left common iliac artery.  Otherwise, unremarkable common iliac arteries, internal and external iliac arteries proximally.  Chest: While not optimized evaluate the pulmonary arterial branches, no filling defect is identified.  The heart is mildly enlarged.  No pericardial effusion.  No pleural effusion.  Minimal dependent atelectasis.  Otherwise, the lungs are clear.  No pneumothorax.  No intrathoracic lymphadenopathy.  Abdomen pelvis:  Organ evaluation is limited in the arterial phase. Within this limitation, unremarkable liver, biliary system, spleen, pancreas, adrenal glands.  Pancreas divisum is incidentally noted.  Symmetric renal enhancement.  No hydronephrosis or hydroureter. Subcentimeter hypodensity within the upper pole of each kidney is too small to further characterize however statistically most likely to represent cysts.  No free intraperitoneal air or fluid.  No bowel obstruction. Normal appendix.  The lower pelvis is not included on the examination.  As this is an angio  examination, the study is still of diagnostic quality.  There is a small fat containing umbilical hernia. No lymphadenopathy.  No acute osseous abnormality.  IMPRESSION: No aortic dissection or aneurysm.  No acute process identified within the chest abdomen pelvis.  The heart size is upper normal limits to mildly enlarged.  Original Report Authenticated By: Waneta Martins, M.D.   Ct Angio Abdomen W/cm &/or Wo Contrast  08/10/2011  *RADIOLOGY REPORT*  Clinical Data: Left-sided chest pain  CT ANGIOGRAPHY CHEST,CT ANGIOGRAPHY ABDOMEN,CT ANGIOGRAPHY OF PELVIS  Technique:  Multidetector CT imaging of the chest using the standard protocol during bolus administration of intravenous contrast. Multiplanar reconstructed images including MIPs were obtained and reviewed to evaluate the vascular anatomy.,Technique: Mul,evaluate the vascular anatomy.  Contrast: 80mL OMNIPAQUE IOHEXOL 350 MG/ML IV SOLN  Comparison: 08/10/2011 chest radiograph  Findings:  Vasculature: Noncontrast images demonstrate no aneurysmal dilatation or intramural hematoma.  Following contrast administration, normal caliber great vessels and aorta.  No dissection.  Normal celiac axis, SMA, renal arteries, and IMA. Minimal atherosclerotic calcification of the left common iliac artery.  Otherwise, unremarkable common iliac arteries, internal and external iliac arteries proximally.  Chest: While not optimized evaluate the pulmonary arterial branches, no filling defect is identified.  The heart is mildly enlarged.  No pericardial effusion.  No pleural effusion.  Minimal dependent atelectasis.  Otherwise, the lungs are clear.  No pneumothorax.  No intrathoracic lymphadenopathy.  Abdomen pelvis:  Organ evaluation is limited in the arterial phase. Within  this limitation, unremarkable liver, biliary system, spleen, pancreas, adrenal glands.  Pancreas divisum is incidentally noted.  Symmetric renal enhancement.  No hydronephrosis or hydroureter. Subcentimeter  hypodensity within the upper pole of each kidney is too small to further characterize however statistically most likely to represent cysts.  No free intraperitoneal air or fluid.  No bowel obstruction. Normal appendix.  The lower pelvis is not included on the examination.  As this is an angio examination, the study is still of diagnostic quality.  There is a small fat containing umbilical hernia. No lymphadenopathy.  No acute osseous abnormality.  IMPRESSION: No aortic dissection or aneurysm.  No acute process identified within the chest abdomen pelvis.  The heart size is upper normal limits to mildly enlarged.  Original Report Authenticated By: Waneta Martins, M.D.   Dg Chest Portable 1 View  08/10/2011  *RADIOLOGY REPORT*  Clinical Data: Chest pain  PORTABLE CHEST - 1 VIEW  Comparison: None.  Findings: Heart is mildly enlarged.  Clear lungs.  Normal vascularity.  IMPRESSION: Cardiomegaly without edema.  Original Report Authenticated By: Donavan Burnet, M.D.    Cardiac Studies: Tele - NSR Cath - results reviewed. Assessment/Plan:  1. Chest pain - note negative CT and cath. Will discharge home. 2. HTN - his blood pressure is controlled. He will continue his outpatient meds. 3. Chronic renal insufficiency - creatinine minimally elevated.  4. Disp. - discharge home. Followup with Dr. Diona Browner.  LOS: 2 days    Lewayne Bunting 08/11/2011, 9:17 AM

## 2011-08-11 NOTE — Discharge Summary (Signed)
Patient ID: Devin Wilson,  MRN: [id number], DOB/AGE: 02-16-1966 46 y.o.  Admit date: 08/09/2011 Discharge date: 08/11/2011  Primary Care Provider: Colette Ribas, MD Primary Cardiologist: Ival Bible, MD  Discharge Diagnoses Principal Problem:  *Unstable angina Active Problems:  Thalassemia minor  Essential hypertension, benign  GERD (gastroesophageal reflux disease)   Allergies Allergies  Allergen Reactions  . Ciprofloxacin Swelling    All flouroquinolones/Facial swelling    Procedures  CTA Chest, Abdomen, Pelvis 3.15.2013   No aortic dissection or aneurysm. No acute process identified within the chest abdomen pelvis. The heart size is upper normal limits to mildly enlarged. _______________________________________________  2D Echocardiogram 08/10/2011  Study Conclusions  Left ventricle: The cavity size was normal. Wall thickness was increased in a pattern of mild LVH. Systolic function was normal. The estimated ejection fraction was in the range of 55% to 60%. Doppler parameters are consistent with abnormal left ventricular relaxation (grade 1 diastolic dysfunction).  ________________________________________________  Cardiac Catheterization 3.15.2013 Hemodynamics: AO 106/64  Coronary angiography:  Coronary dominance: right There were separate ostia off the left cusp for the LAD and LCx.  Left anterior descending (LAD): Large vessel originating directly from left cusp.  There were luminal irregularities but no significant stenosis.   Left circumflex (LCx): Large vessel originating directly from the left cusp.  There were luminal irregularities but no signficant stenosis.   Right coronary artery (RCA): Large dominant vessel with no significant stenosis.  Luminal irregularities only.   _______________________________________________  History of Present Illness  46 y/o male with the above problem list.  He was in his usoh until the evening prior to  admission, when while exercising on his elliptical, he developed left sided chest discomfort radiating to his back and down his arm.  When symptoms persisted despite rest and an ASA, pt presented to the [facility] ER for evaluation.  There, He was noted to have mild elevation of his CK & MB with a normal troponin.  CT scan was performed and showed no evidence of dissection.  He was admitted to [facility] and subsequently r/o for MI.  Cardiology was consulted and given pts history and risk factors, it was felt that he would require invasive evaluation to r/o obstructive CAD.  He was transferred to [facility] for further evaluation.  Hospital Course  Following arrival to [facility], pt was pain free and taken to the cardiac catheterization laboratory where he underwent diagnostic cardiac catheterization revealing relatively normal coronary arteries as outlined above.  2D echo was also performed and showed normal LV function.  Overnight, the patient has had no recurrent chest pain and has been ambulating without difficulty.  He will be discharged home today in good condition.  Discharge Vitals Blood pressure 120/78, pulse 57, temperature 98 F (36.7 C), temperature source Oral, resp. rate 16, height 5\' 9"  (1.753 m), weight 168 lb 14.4 oz (76.613 kg), SpO2 100.00%.  Filed Weights   08/10/11 0535 08/10/11 2200 08/11/11 0609  Weight: 174 lb 9.7 oz (79.2 kg) 171 lb 4.8 oz (77.7 kg) 168 lb 14.4 oz (76.613 kg)   Labs  CBC  Basename 08/11/11 0630 08/10/11 2007  WBC 3.9* 4.5  NEUTROABS -- --  HGB 11.5* 11.1*  HCT 36.0* 34.6*  MCV 73.6* 73.9*  PLT 258 160   Basic Metabolic Panel  Basename 08/11/11 0630 08/10/11 2007 08/10/11 1230  NA 140 -- 137  K 3.4* -- 3.6  CL 102 -- 100  CO2 31 -- 31  GLUCOSE 91 -- 92  BUN 12 -- 15  CREATININE 1.43* 1.28 --  CALCIUM 9.4 -- 9.4  MG -- -- --  PHOS -- -- --   Liver Function Tests  Basename 08/11/11 0630  AST 24  ALT 23  ALKPHOS 75  BILITOT 0.9  PROT 6.2    ALBUMIN 3.4*   Cardiac Enzymes  Basename 08/10/11 1230 08/10/11 0446  CKTOTAL 476* 618*  CKMB 3.7 5.1*  CKMBINDEX -- --  TROPONINI <0.30 <0.30   D-Dimer  Basename 08/10/11 0011  DDIMER 0.23   Fasting Lipid Panel  Basename 08/10/11 0456  CHOL 181  HDL 52  LDLCALC 116*  TRIG 67  CHOLHDL 3.5  LDLDIRECT --   Disposition  Pt is being discharged home today in good condition.  Follow-up Plans & Appointments  Follow-up Information    Follow up with Colette Ribas, MD. (As scheduled)       Follow up with Nona Dell, MD in 2 weeks. (We will arrange)    Contact information:   [address] [place] [address] [phone]          Discharge Medications  Medication List  As of 08/11/2011 11:22 AM   TAKE these medications         amLODipine 5 MG tablet   Commonly known as: NORVASC   Take 5 mg by mouth 2 (two) times daily.      aspirin 81 MG chewable tablet   Chew 1 tablet (81 mg total) by mouth daily.      cholecalciferol 1000 UNITS tablet   Commonly known as: VITAMIN D   Take 2,000 Units by mouth daily.      cloNIDine 0.1 mg/24hr patch   Commonly known as: CATAPRES - Dosed in mg/24 hr   Place 1 patch onto the skin once a week.      hydrochlorothiazide 25 MG tablet   Commonly known as: HYDRODIURIL   Take 25 mg by mouth daily.      multivitamin capsule   Take 1 capsule by mouth daily.      nebivolol 10 MG tablet   Commonly known as: BYSTOLIC   Take 10 mg by mouth 2 times daily at 12 noon and 4 pm.      olmesartan 40 MG tablet   Commonly known as: BENICAR   Take 40 mg by mouth daily.      omeprazole 20 MG capsule   Commonly known as: PRILOSEC   Take 20 mg by mouth as needed.            Outstanding Labs/Studies  None  Duration of Discharge Encounter   Greater than 30 minutes including physician time.  Signed, Nicolasa Ducking NP 08/11/2011, 11:22 AM

## 2011-08-11 NOTE — Discharge Instructions (Signed)

## 2012-02-20 ENCOUNTER — Encounter: Payer: Self-pay | Admitting: Orthopedic Surgery

## 2012-02-20 ENCOUNTER — Ambulatory Visit (INDEPENDENT_AMBULATORY_CARE_PROVIDER_SITE_OTHER): Payer: Managed Care, Other (non HMO) | Admitting: Orthopedic Surgery

## 2012-02-20 ENCOUNTER — Ambulatory Visit (INDEPENDENT_AMBULATORY_CARE_PROVIDER_SITE_OTHER): Payer: Managed Care, Other (non HMO)

## 2012-02-20 VITALS — BP 124/70 | Ht 69.0 in | Wt 174.0 lb

## 2012-02-20 DIAGNOSIS — M23329 Other meniscus derangements, posterior horn of medial meniscus, unspecified knee: Secondary | ICD-10-CM

## 2012-02-20 DIAGNOSIS — M25569 Pain in unspecified knee: Secondary | ICD-10-CM

## 2012-02-20 DIAGNOSIS — M25562 Pain in left knee: Secondary | ICD-10-CM

## 2012-02-20 NOTE — Patient Instructions (Addendum)
MRI (good in the morning before 9 ) and Friday evening is good

## 2012-02-20 NOTE — Progress Notes (Signed)
  Subjective:    Patient ID: Devin Wilson, male    DOB: 25-Sep-1965, 46 y.o.   MRN: [id number]  HPI Comments: One half years ago. The patient was playing soccer he twisted his knee felt acute sharp pain on the medial aspect was unable to finish his soccer game. He treated himself with anti-inflammatories, using naproxen and Advil as well as Aleve, ice, wrapping the knee with bracing  Knee Pain  Incident onset: injured 1 year ago, playing soccer  [place] The injury mechanism was a twisting injury. The pain is present in the left knee. The quality of the pain is described as aching. The pain is at a severity of 5/10. The pain has been constant since onset. Pertinent negatives include no inability to bear weight, loss of motion, loss of sensation, muscle weakness, numbness or tingling. Associated symptoms comments: Locking .      Review of Systems  Neurological: Negative for tingling and numbness.       Objective:   Physical Exam  Constitutional: He is oriented to person, place, and time. He appears well-developed and well-nourished.  HENT:  Head: Normocephalic.  Neck: Normal range of motion.  Cardiovascular: Normal rate and intact distal pulses.   Abdominal: He exhibits no distension.  Neurological: He is alert and oriented to person, place, and time. He has normal reflexes.  Skin: Skin is warm and dry.  Psychiatric: He has a normal mood and affect. His behavior is normal. Judgment and thought content normal.   Right Knee Exam  Right knee exam is normal.  Tenderness  The patient is experiencing no tenderness.     Range of Motion  The patient has normal right knee ROM.  Tests  McMurray:  Medial - negative  Lachman:  Anterior - negative    Posterior - negative  Other  Erythema: absent Scars: absent Sensation: normal Pulse: absent Swelling: none   Left Knee Exam   Tenderness  The patient is experiencing tenderness in the medial joint  line.  Range of Motion  The patient has normal left knee ROM.  Muscle Strength   The patient has normal left knee strength.  Tests  McMurray:  Medial - positive Lateral - negative Lachman:  Anterior - negative    Posterior - negative Drawer:       Anterior - negative     Posterior - negative Varus: positive Valgus: negative Patellar Apprehension: negative  Other  Erythema: absent Scars: absent Sensation: normal Pulse: present Swelling: none           Assessment & Plan:  Plain films evaluation show patellofemoral arthritis with bone spurs, but minimal if any tibial femoral arthritis.  Impression torn medial meniscus.  Plan MRI LEFT knee. Patient is a candidate for surgery.

## 2012-02-29 ENCOUNTER — Ambulatory Visit (HOSPITAL_COMMUNITY)
Admission: RE | Admit: 2012-02-29 | Discharge: 2012-02-29 | Disposition: A | Discharge status: Home / Self Care | Payer: Managed Care, Other (non HMO) | Source: Ambulatory Visit | Attending: Orthopedic Surgery | Admitting: Orthopedic Surgery

## 2012-02-29 DIAGNOSIS — M25569 Pain in unspecified knee: Secondary | ICD-10-CM | POA: Insufficient documentation

## 2012-02-29 DIAGNOSIS — M23329 Other meniscus derangements, posterior horn of medial meniscus, unspecified knee: Secondary | ICD-10-CM | POA: Insufficient documentation

## 2012-02-29 DIAGNOSIS — M2240 Chondromalacia patellae, unspecified knee: Secondary | ICD-10-CM | POA: Insufficient documentation

## 2012-06-04 ENCOUNTER — Other Ambulatory Visit: Payer: Self-pay | Admitting: Neurology

## 2012-06-04 ENCOUNTER — Ambulatory Visit
Admission: AD | Admit: 2012-06-04 | Payer: Managed Care, Other (non HMO) | Source: Ambulatory Visit | Admitting: Neurology

## 2012-06-04 ENCOUNTER — Ambulatory Visit (HOSPITAL_COMMUNITY)
Admission: AD | Admit: 2012-06-04 | Discharge: 2012-06-04 | Disposition: A | Discharge status: Home / Self Care | Payer: Managed Care, Other (non HMO) | Attending: Neurology | Admitting: Neurology

## 2012-06-04 ENCOUNTER — Ambulatory Visit (HOSPITAL_COMMUNITY)
Admission: RE | Admit: 2012-06-04 | Discharge: 2012-06-04 | Disposition: A | Discharge status: Home / Self Care | Payer: Managed Care, Other (non HMO) | Source: Ambulatory Visit | Attending: Neurology | Admitting: Neurology

## 2012-06-04 DIAGNOSIS — M79609 Pain in unspecified limb: Secondary | ICD-10-CM | POA: Insufficient documentation

## 2012-06-04 DIAGNOSIS — M79642 Pain in left hand: Secondary | ICD-10-CM

## 2012-06-04 DIAGNOSIS — S6990XA Unspecified injury of unspecified wrist, hand and finger(s), initial encounter: Secondary | ICD-10-CM | POA: Insufficient documentation

## 2012-06-04 DIAGNOSIS — T1490XA Injury, unspecified, initial encounter: Secondary | ICD-10-CM

## 2012-06-04 DIAGNOSIS — X58XXXA Exposure to other specified factors, initial encounter: Secondary | ICD-10-CM | POA: Insufficient documentation

## 2014-05-06 ENCOUNTER — Encounter (HOSPITAL_COMMUNITY): Payer: Self-pay | Admitting: Cardiology

## 2014-08-30 ENCOUNTER — Ambulatory Visit (INDEPENDENT_AMBULATORY_CARE_PROVIDER_SITE_OTHER): Payer: BLUE CROSS/BLUE SHIELD

## 2014-08-30 ENCOUNTER — Ambulatory Visit (INDEPENDENT_AMBULATORY_CARE_PROVIDER_SITE_OTHER): Payer: BLUE CROSS/BLUE SHIELD | Admitting: Orthopedic Surgery

## 2014-08-30 ENCOUNTER — Encounter: Payer: Self-pay | Admitting: Orthopedic Surgery

## 2014-08-30 VITALS — BP 149/95 | Ht 69.0 in | Wt 179.2 lb

## 2014-08-30 DIAGNOSIS — M25542 Pain in joints of left hand: Secondary | ICD-10-CM

## 2014-08-30 DIAGNOSIS — M79642 Pain in left hand: Secondary | ICD-10-CM

## 2014-08-30 NOTE — Patient Instructions (Signed)
Tylenol and ice  May continue to use

## 2014-08-30 NOTE — Progress Notes (Signed)
Established patient with new problem  Chief complaint pain left hand. One day history of pain over the left hand involving the metacarpophalangeal joint of the ring finger. Patient was playing soccer yesterday fell onto an outstretched hand complains of pain over the metacarpophalangeal joint of the ring finger. Pain swelling decreased range of motion. Pain is dull throbbing. 6 out of 10. Taking Tylenol using ice. Denies system review findings. He reports medical history of hypertension kidney disease  He's had a vasectomy in 1999  Currently takes Maxzide 25-3 7.5, clonidine patch 0.3, diltiazem 360, clonidine 0.1, potassium 10 mEq  Ciprofloxacin and Levaquin causes swelling as an allergy  Family history hypertension  Does not smoke  Alcohol use social maybe twice a week  No drugs  Neurologist as profession  Exam well-developed well-nourished male grooming hygiene intact oriented 3 mood normal gait unremarkable noncontributory  Left hand tenderness over the metacarpophalangeal joint pain is increased and reproduced with hyperextension decrease with flexion of the MP joint range of motion is full no instability detected strength exam limited by pain skin is intact sensations good perfusion is normal  BP 149/95 mmHg  Ht 5\' 9"  (1.753 m)  Wt 179 lb 3.2 oz (81.285 kg)  BMI 26.45 kg/m2   X-rays are inconclusive. Point of maximal tenderness does not seem to show fracture on x-ray there is an abnormality at the base of the fourth metacarpal which clinically is significant  Impression hyperextension versus hairline fracture  Recommend Tylenol active range of motion limit lifting to 5 pounds or less  Follow-up as needed

## 2015-02-21 ENCOUNTER — Ambulatory Visit (INDEPENDENT_AMBULATORY_CARE_PROVIDER_SITE_OTHER): Payer: BLUE CROSS/BLUE SHIELD | Admitting: Orthopedic Surgery

## 2015-02-21 ENCOUNTER — Encounter: Payer: Self-pay | Admitting: Orthopedic Surgery

## 2015-02-21 ENCOUNTER — Ambulatory Visit (INDEPENDENT_AMBULATORY_CARE_PROVIDER_SITE_OTHER): Payer: BLUE CROSS/BLUE SHIELD

## 2015-02-21 VITALS — BP 128/86 | Ht 69.0 in | Wt 175.0 lb

## 2015-02-21 DIAGNOSIS — S638X1A Sprain of other part of right wrist and hand, initial encounter: Secondary | ICD-10-CM

## 2015-02-21 DIAGNOSIS — M79641 Pain in right hand: Secondary | ICD-10-CM

## 2015-02-21 NOTE — Progress Notes (Signed)
Patient ID: Devin Wilson, male   DOB: [birth year], 49 y.o.   MRN: [id number]  ESTABLISHED PATIENT NEW PROBLEM   Chief Complaint  Patient presents with  . Hand Injury    right hand injury w/ pain and swelling, DOI 02/20/15 sports injury     Devin Wilson is a 50 y.o. male.   HPI 49 year old male was playing soccer yesterday fell after he was pushed complains of pain and swelling immediately in the right hand described as a dull throbbing ache over the fifth and fourth CMC joints. He rates his pain 8 out of 10. His review of systems is negative. His medical history as described below Review of Systems See hpi  Past Medical History  Diagnosis Date  . Hypertension   . Angina   . GERD (gastroesophageal reflux disease)   . Migraines   . Thalassemia minor   . Bradycardia, sinus   . Chest pain     a. 3.15.2013 Cath: Minor Irregs    Past Surgical History  Procedure Laterality Date  . Cardiac catheterization  08/10/11  . Vasectomy    . Hernia repair      "in infancy; umbilical"  . Left heart catheterization with coronary angiogram N/A 08/10/2011    Procedure: LEFT HEART CATHETERIZATION WITH CORONARY ANGIOGRAM;  Surgeon: [name], MD;  Location: [facility] CATH LAB;  Service: Cardiovascular;  Laterality: N/A;    Family History  Problem Relation Age of Onset  . Aneurysm Father     Social History Social History  Substance Use Topics  . Smoking status: Never Smoker   . Smokeless tobacco: Never Used  . Alcohol Use: 0.6 oz/week    1 Glasses of wine per week    Allergies  Allergen Reactions  . Ciprofloxacin Swelling    All flouroquinolones/Facial swelling  . Levaquin [Levofloxacin In D5w]     Current Outpatient Prescriptions  Medication Sig Dispense Refill  . atenolol (TENORMIN) 50 MG tablet Take 50 mg by mouth daily.    . cloNIDine (CATAPRES) 0.1 MG tablet Take 0.1 mg by mouth 2 (two) times daily.    Marland Kitchen diltiazem (TIAZAC) 360 MG 24 hr capsule Take 360 mg by mouth daily.     Marland Kitchen lisinopril (PRINIVIL,ZESTRIL) 40 MG tablet Take 40 mg by mouth daily.    . potassium chloride (K-DUR,KLOR-CON) 10 MEQ tablet Take 10 mEq by mouth 2 (two) times daily.    . traZODone (DESYREL) 50 MG tablet Take 50 mg by mouth at bedtime.    . triamterene-hydrochlorothiazide (MAXZIDE-25) 37.5-25 MG per tablet Take 1 tablet by mouth daily.     No current facility-administered medications for this visit.       Physical Exam Blood pressure 128/86, height 5\' 9"  (1.753 m), weight 175 lb (79.379 kg). Physical Exam The patient is well developed well nourished and well groomed. Orientation to person place and time is normal  Mood is pleasant. Ambulatory status is normal without a limp Skin remains intact without laceration ulceration or erythema Gross motor exam is intact without atrophy. Muscle tone normal grade 5 motor strength Neurovascular exam remains intact Inspection significant and severe and excessive dorsal swelling over the right hand. He has full range of motion but it is painful. He has no palpable step-off at the [facility] joint but tenderness over the fourth and fifth CMC joint.    Data Reviewed My interpretation of the x-rays Imaging studies show soft tissue swelling without fracture  Assessment   CMC sprain right hand  fourth and fifth Plan   Splint 3 weeks follow-up

## 2015-03-03 ENCOUNTER — Ambulatory Visit: Payer: BLUE CROSS/BLUE SHIELD | Admitting: "Endocrinology

## 2015-03-08 ENCOUNTER — Ambulatory Visit: Payer: BLUE CROSS/BLUE SHIELD | Admitting: Orthopedic Surgery

## 2015-03-14 ENCOUNTER — Ambulatory Visit (INDEPENDENT_AMBULATORY_CARE_PROVIDER_SITE_OTHER): Payer: BLUE CROSS/BLUE SHIELD | Admitting: Orthopedic Surgery

## 2015-03-14 VITALS — BP 141/94 | Ht 69.0 in | Wt 179.0 lb

## 2015-03-14 DIAGNOSIS — S638X1D Sprain of other part of right wrist and hand, subsequent encounter: Secondary | ICD-10-CM | POA: Diagnosis not present

## 2015-03-14 NOTE — Patient Instructions (Signed)
Continue movement and icing

## 2015-03-14 NOTE — Progress Notes (Signed)
   Patient ID: Devin Wilson, male   DOB: 1965/10/27, 49 y.o.   MRN: [id number]  Chief Complaint  Patient presents with  . Follow-up    3 week follow p on right hand. DOI 02-28-15.    Right hand injury x-ray was questionable pain over the [facility] joint #5 and 4  Patient improving with still some pain with the use of the hand. Swelling is going down but still present tenderness still at the [facility] joint #5 and painful stress test but no instability  Recommend continue range of motion exercises ice it in today for swelling  May take 6-8 weeks for complete resolution of healing  Follow-up as needed

## 2015-03-18 ENCOUNTER — Encounter: Payer: BLUE CROSS/BLUE SHIELD | Admitting: Neurology

## 2016-10-19 DIAGNOSIS — I10 Essential (primary) hypertension: Secondary | ICD-10-CM | POA: Diagnosis not present

## 2016-10-19 DIAGNOSIS — E876 Hypokalemia: Secondary | ICD-10-CM | POA: Diagnosis not present

## 2016-10-19 DIAGNOSIS — N182 Chronic kidney disease, stage 2 (mild): Secondary | ICD-10-CM | POA: Diagnosis not present

## 2016-11-16 DIAGNOSIS — I10 Essential (primary) hypertension: Secondary | ICD-10-CM | POA: Diagnosis not present

## 2016-11-16 DIAGNOSIS — Z79899 Other long term (current) drug therapy: Secondary | ICD-10-CM | POA: Diagnosis not present

## 2017-03-05 DIAGNOSIS — Z79899 Other long term (current) drug therapy: Secondary | ICD-10-CM | POA: Diagnosis not present

## 2017-03-05 DIAGNOSIS — I10 Essential (primary) hypertension: Secondary | ICD-10-CM | POA: Diagnosis not present

## 2017-03-14 DIAGNOSIS — Z23 Encounter for immunization: Secondary | ICD-10-CM | POA: Diagnosis not present

## 2017-05-27 ENCOUNTER — Ambulatory Visit (HOSPITAL_COMMUNITY)
Admission: RE | Admit: 2017-05-27 | Discharge: 2017-05-27 | Disposition: A | Discharge status: Home / Self Care | Payer: 59 | Source: Ambulatory Visit | Attending: Neurology | Admitting: Neurology

## 2017-05-27 ENCOUNTER — Other Ambulatory Visit: Payer: Self-pay | Admitting: Neurology

## 2017-05-27 DIAGNOSIS — M545 Low back pain: Secondary | ICD-10-CM | POA: Insufficient documentation

## 2017-05-27 DIAGNOSIS — M9089 Osteopathy in diseases classified elsewhere, multiple sites: Secondary | ICD-10-CM | POA: Diagnosis not present

## 2017-05-27 DIAGNOSIS — M549 Dorsalgia, unspecified: Secondary | ICD-10-CM | POA: Diagnosis not present

## 2017-05-27 DIAGNOSIS — Z79899 Other long term (current) drug therapy: Secondary | ICD-10-CM | POA: Diagnosis not present

## 2017-05-31 DIAGNOSIS — I129 Hypertensive chronic kidney disease with stage 1 through stage 4 chronic kidney disease, or unspecified chronic kidney disease: Secondary | ICD-10-CM | POA: Diagnosis not present

## 2017-05-31 DIAGNOSIS — N182 Chronic kidney disease, stage 2 (mild): Secondary | ICD-10-CM | POA: Diagnosis not present

## 2017-05-31 DIAGNOSIS — E876 Hypokalemia: Secondary | ICD-10-CM | POA: Diagnosis not present

## 2017-09-20 ENCOUNTER — Other Ambulatory Visit: Payer: Self-pay | Admitting: Neurology

## 2017-09-20 ENCOUNTER — Ambulatory Visit (HOSPITAL_COMMUNITY)
Admission: RE | Admit: 2017-09-20 | Discharge: 2017-09-20 | Disposition: A | Discharge status: Home / Self Care | Payer: 59 | Source: Ambulatory Visit | Attending: Neurology | Admitting: Neurology

## 2017-09-20 DIAGNOSIS — M25572 Pain in left ankle and joints of left foot: Secondary | ICD-10-CM | POA: Insufficient documentation

## 2017-09-20 DIAGNOSIS — M7989 Other specified soft tissue disorders: Secondary | ICD-10-CM | POA: Diagnosis not present

## 2017-09-20 DIAGNOSIS — M79605 Pain in left leg: Secondary | ICD-10-CM

## 2017-09-20 DIAGNOSIS — M19072 Primary osteoarthritis, left ankle and foot: Secondary | ICD-10-CM | POA: Diagnosis not present

## 2017-11-18 DIAGNOSIS — I10 Essential (primary) hypertension: Secondary | ICD-10-CM | POA: Diagnosis not present

## 2017-11-18 DIAGNOSIS — Z79899 Other long term (current) drug therapy: Secondary | ICD-10-CM | POA: Diagnosis not present

## 2017-11-22 ENCOUNTER — Ambulatory Visit (INDEPENDENT_AMBULATORY_CARE_PROVIDER_SITE_OTHER): Payer: 59 | Admitting: Orthopedic Surgery

## 2017-11-22 ENCOUNTER — Encounter: Payer: Self-pay | Admitting: Orthopedic Surgery

## 2017-11-22 VITALS — BP 128/81 | HR 61 | Ht 69.0 in | Wt 178.0 lb

## 2017-11-22 DIAGNOSIS — M25572 Pain in left ankle and joints of left foot: Secondary | ICD-10-CM
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
## 2017-11-29 DIAGNOSIS — Z79899 Other long term (current) drug therapy: Secondary | ICD-10-CM | POA: Diagnosis not present

## 2017-11-29 LAB — BASIC METABOLIC PANEL
BUN: 15 (ref 4–21)
CREATININE: 1.6 — AB (ref 0.6–1.3)
Glucose: 70
Potassium: 3.6 (ref 3.4–5.3)
SODIUM: 140 (ref 137–147)

## 2017-12-20 DIAGNOSIS — B009 Herpesviral infection, unspecified: Secondary | ICD-10-CM | POA: Insufficient documentation

## 2018-01-09 ENCOUNTER — Encounter: Payer: Self-pay | Admitting: "Endocrinology

## 2018-01-09 ENCOUNTER — Ambulatory Visit (INDEPENDENT_AMBULATORY_CARE_PROVIDER_SITE_OTHER): Payer: 59 | Admitting: "Endocrinology

## 2018-01-09 VITALS — BP 117/79 | HR 69 | Ht 69.0 in | Wt 180.2 lb

## 2018-01-09 DIAGNOSIS — N182 Chronic kidney disease, stage 2 (mild): Secondary | ICD-10-CM

## 2018-01-09 DIAGNOSIS — I10 Essential (primary) hypertension: Secondary | ICD-10-CM

## 2018-01-09 NOTE — Progress Notes (Signed)
Endocrinology Consult Note                                            01/13/2018, 12:34 PM   Subjective:    Patient ID: Devin Wilson, male    DOB: 1966-03-16, PCP Sharilyn Sites, MD   Past Medical History:  Diagnosis Date  . Angina   . Bradycardia, sinus   . Chest pain    a. 3.15.2013 Cath: Minor Irregs  . GERD (gastroesophageal reflux disease)   . Hypertension   . Migraines   . Thalassemia minor    Past Surgical History:  Procedure Laterality Date  . CARDIAC CATHETERIZATION  08/10/11  . HERNIA REPAIR     "in infancy; umbilical"  . LEFT HEART CATHETERIZATION WITH CORONARY ANGIOGRAM N/A 08/10/2011   Procedure: LEFT HEART CATHETERIZATION WITH CORONARY ANGIOGRAM;  Surgeon: [name], MD;  Location: [facility] CATH LAB;  Service: Cardiovascular;  Laterality: N/A;  . VASECTOMY     Social History   Socioeconomic History  . Marital status: Married    Spouse name: Not on file  . Number of children: Not on file  . Years of education: Not on file  . Highest education level: Not on file  Occupational History  . Occupation: Physician    Comment: Neurologist  Social Needs  . Financial resource strain: Not on file  . Food insecurity:    Worry: Not on file    Inability: Not on file  . Transportation needs:    Medical: Not on file    Non-medical: Not on file  Tobacco Use  . Smoking status: Never Smoker  . Smokeless tobacco: Never Used  Substance and Sexual Activity  . Alcohol use: Yes    Alcohol/week: 1.0 standard drinks    Types: 1 Glasses of wine per week  . Drug use: No  . Sexual activity: Yes  Lifestyle  . Physical activity:    Days per week: Not on file    Minutes per session: Not on file  . Stress: Not on file  Relationships  . Social connections:    Talks on phone: Not on file    Gets together: Not on file    Attends religious service: Not on file    Active member of club or organization: Not on file    Attends meetings of clubs or organizations: Not  on file    Relationship status: Not on file  Other Topics Concern  . Not on file  Social History Narrative  . Not on file   Outpatient Encounter Medications as of 01/09/2018  Medication Sig  . amLODipine (NORVASC) 10 MG tablet   . cloNIDine (CATAPRES) 0.1 MG tablet Take 0.2 mg by mouth at bedtime.  Marland Kitchen labetalol (NORMODYNE) 300 MG tablet Take 900 mg by mouth 2 (two) times daily.  Marland Kitchen lisinopril (PRINIVIL,ZESTRIL) 40 MG tablet Take 40 mg by mouth daily.  . potassium chloride SA (K-DUR,KLOR-CON) 20 MEQ tablet Take 80 mEq by mouth daily.  Marland Kitchen triamterene-hydrochlorothiazide (MAXZIDE-25) 37.5-25 MG per tablet Take 1 tablet by mouth daily.  . [DISCONTINUED] potassium chloride (K-DUR,KLOR-CON) 10 MEQ tablet Take 10 mEq by mouth 2 (two) times daily.  . [DISCONTINUED] traZODone (DESYREL) 50 MG tablet Take 50 mg by mouth at bedtime.   No facility-administered encounter medications on file as of 01/09/2018.    ALLERGIES: Allergies  Allergen Reactions  . Ciprofloxacin Swelling    All flouroquinolones/Facial swelling  . Levaquin [Levofloxacin In D5w]     VACCINATION STATUS: There is no immunization history for the selected administration types on file for this patient.  HPI Devin Wilson is 52 y.o. male , practicing neurologist/physician, who presents today with a medical history as above. he is being seen in consultation for uncontrolled hypertension requested by Sharilyn Sites, MD.   He was seen in consultation for chronically uncontrolled hypertension in November 2016.   He was diagnosed with hypertension  at age 15. His medication list slowly grew to current 6 medications including amlodipine 10 mg p.o. daily, clonidine 0.2 mg p.o. nightly, labetalol 900 mg p.o. twice daily, lisinopril 40 mg p.o. daily, Maxide 37.5/25 mg p.o. daily.  He also has spontaneous hypokalemia which requires regular supplement with potassium equivalents-currently 80 mEq of potassium daily. He has strong family hx of  hypertension  in his both parents ( his mother at age 37, his father has had complications of hypertension at age 11 , died of thoracic aortic aneurysm). He has been seen by nephrology in the past to rule of primary aldosteronism, reports are not available to review.  CT angiogram of renal arteries as well as abdominal ultrasound in the remote past have been unremarkable. He denies history of coronary artery disease, CVA, retinopathy.  His most recent CMP shows elevated serum creatinine of 1.6 (normal 0.7-1.33).  He is a non-smoker, exercises regularly-playing soccer.  No history of alcohol abuse.  The most he ever weighed was 185 pounds with BMI of 27, currently weighs 180 pounds with BMI of 26.6.     -Currently has no acute symptoms or complaints.  He denies chest pain, headaches, palpitations, flushing/pallor.  He notes that occasionally he loses control of his blood pressure into significantly higher levels.  He denies consumption of over-the-counter products specifically denies any consumption of licorice. -He denies any history of diabetes   Review of Systems  Constitutional:  + Minimally fluctuating body weight,  no fatigue, no subjective hyperthermia, no subjective hypothermia Eyes: no blurry vision, no xerophthalmia ENT: no sore throat, no nodules palpated in throat, no dysphagia/odynophagia, no hoarseness Cardiovascular: no Chest Pain, no Shortness of Breath, no palpitations, no leg swelling Respiratory: no cough, no SOB Gastrointestinal: no Nausea/Vomiting/Diarhhea Musculoskeletal: no muscle/joint aches Skin: no rashes Neurological: no tremors, no numbness, no tingling, no dizziness Psychiatric: no depression, no anxiety  Objective:    BP 117/79   Pulse 69   Ht 5\' 9"  (1.753 m)   Wt 180 lb 3.2 oz (81.7 kg)   BMI 26.61 kg/m   Wt Readings from Last 3 Encounters:  01/09/18 180 lb 3.2 oz (81.7 kg)  11/22/17 178 lb (80.7 kg)  03/14/15 179 lb (81.2 kg)    Physical  Exam  Constitutional: + Slightly over weight for height, not in acute distress, normal state of mind Eyes: PERRLA, EOMI, no exophthalmos ENT: moist mucous membranes, no thyromegaly, no cervical lymphadenopathy Cardiovascular: normal precordial activity, Regular Rate and Rhythm, no Murmur/Rubs/Gallops Respiratory:  adequate breathing efforts, no gross chest deformity, Clear to auscultation bilaterally Gastrointestinal: abdomen soft, Non -tender, No distension, Bowel Sounds present, + absent abdominal bruit Musculoskeletal: no gross deformities, strength intact in all four extremities Skin: moist, warm, no rashes Neurological: no tremor with outstretched hands, Deep tendon reflexes normal in all four extremities.   CMP ( most recent) CMP     Component Value Date/Time   NA 140  11/29/2017   K 3.6 11/29/2017   CL 102 08/11/2011 0630   CO2 31 08/11/2011 0630   GLUCOSE 91 08/11/2011 0630   BUN 15 11/29/2017   CREATININE 1.6 (A) 11/29/2017   CREATININE 1.43 (H) 08/11/2011 0630   CALCIUM 9.4 08/11/2011 0630   PROT 6.2 08/11/2011 0630   ALBUMIN 3.4 (L) 08/11/2011 0630   AST 24 08/11/2011 0630   ALT 23 08/11/2011 0630   ALKPHOS 75 08/11/2011 0630   BILITOT 0.9 08/11/2011 0630   GFRNONAA 58 (L) 08/11/2011 0630   GFRAA 67 (L) 08/11/2011 0630     Lipid Panel ( most recent) Lipid Panel     Component Value Date/Time   CHOL 181 08/10/2011 0456   TRIG 67 08/10/2011 0456   HDL 52 08/10/2011 0456   CHOLHDL 3.5 08/10/2011 0456   VLDL 13 08/10/2011 0456   LDLCALC 116 (H) 08/10/2011 0456     In May 2016 he underwent labs with the following results: Plasma renin activity 0.05 (normal 0.25-5.82) Aldosterone 7 (normal<28 upright), 9<21 supine) Aldo/PRA 140 (normal 0.9-28.9)   Assessment & Plan:   1. Primary hypertension  - Tyrion A Nippert  is being seen at a kind request of Sharilyn Sites, MD. - I have reviewed his available records and clinically evaluated the patient. - Based on  reviews, he has chronic hypertension diagnosed at relatively young age of 50, requiring 5-6 medications to control to target.  -His previous work-up did not lead into a secondary cause, however, it is likely that he has undiagnosed secondary cause for hypertension. -His previous ALDO/PRA ratio of 140, is not indicative of primary hyperaldosteronism, due to the fact that his aldosterone level was only 7 ng per DL.  - he will need a repeat,  more complete work-up to look for etiology. -His current potassium is acceptable at 3.6, although requiring up to 80 mEq of potassium daily.  I will proceed to obtain basic work-up including: - Aldosterone + renin activity w/ ratio - COMPLETE METABOLIC PANEL WITH GFR - Magnesium - Phosphorus - Cortisol-am, blood - 17-Hydroxyprogesterone   If his  labs are suggestive of primary hyperaldosteronism, he will be considered for imaging studies.  If these labs indicate other more rare endocrinology etiologies such as congenital adrenal hyperplasia, he will be considered for more confirmatory tests as necessary.  -In the meantime, he is advised to continue his current blood pressure medications with no change.   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  Chronic hypertension likely etiology.  He needs close follow-up by PMD as well as nephrology.   I did not initiate a new prescription today.  -Advised to continue his current exercise regimen, stay away from smoking, excessive alcohol consumption.   - I advised him  to maintain close follow up with Sharilyn Sites, MD for primary care needs.   - Time spent with the patient: 45 minutes, of which >50% was spent in obtaining information about his symptoms, reviewing his previous labs, imaging studies, evaluations, and treatments, counseling him about his chronic hypertension, and developing a plan to confirm the diagnosis and long term treatment as necessary.  Trace A  Casebolt participated in the discussions, expressed understanding, and voiced agreement with the above plans.  All questions were answered to his satisfaction. he is encouraged to contact clinic should he have any questions or concerns prior to his return visit.  Follow up plan: Return in about 4 weeks (around  02/06/2018).   Glade Lloyd, MD [facility] [address] [place], [place] [address] Phone: [phone]  Fax: [phone]     01/13/2018, 12:34 PM  This note was partially dictated with voice recognition software. Similar sounding words can be transcribed inadequately or may not  be corrected upon review.

## 2018-01-13 DIAGNOSIS — N182 Chronic kidney disease, stage 2 (mild): Secondary | ICD-10-CM | POA: Insufficient documentation

## 2018-01-16 DIAGNOSIS — I10 Essential (primary) hypertension: Secondary | ICD-10-CM | POA: Diagnosis not present

## 2018-01-21 LAB — COMPLETE METABOLIC PANEL WITH GFR
AG RATIO: 1.6 (calc) (ref 1.0–2.5)
ALBUMIN MSPROF: 4.2 g/dL (ref 3.6–5.1)
ALT: 20 U/L (ref 9–46)
AST: 30 U/L (ref 10–35)
Alkaline phosphatase (APISO): 92 U/L (ref 40–115)
BUN / CREAT RATIO: 11 (calc) (ref 6–22)
BUN: 18 mg/dL (ref 7–25)
CALCIUM: 9.7 mg/dL (ref 8.6–10.3)
CO2: 31 mmol/L (ref 20–32)
CREATININE: 1.59 mg/dL — AB (ref 0.70–1.33)
Chloride: 102 mmol/L (ref 98–110)
GFR, EST NON AFRICAN AMERICAN: 49 mL/min/{1.73_m2} — AB (ref >=60)
GFR, Est African American: 57 mL/min/{1.73_m2} — ABNORMAL LOW (ref >=60)
Globulin: 2.6 g/dL (calc) (ref 1.9–3.7)
Glucose, Bld: 89 mg/dL (ref 65–99)
Potassium: 3.5 mmol/L (ref 3.5–5.3)
Sodium: 142 mmol/L (ref 135–146)
Total Bilirubin: 0.9 mg/dL (ref 0.2–1.2)
Total Protein: 6.8 g/dL (ref 6.1–8.1)

## 2018-01-21 LAB — PHOSPHORUS: Phosphorus: 3.2 mg/dL (ref 2.5–4.5)

## 2018-01-21 LAB — 17-HYDROXYPROGESTERONE: 17-OH-PROGESTERONE, LC/MS/MS: 78 ng/dL (ref 37–129)

## 2018-01-21 LAB — ALDOSTERONE + RENIN ACTIVITY W/ RATIO
ALDO / PRA Ratio: 268.4 Ratio — ABNORMAL HIGH (ref 0.9–28.9)
Aldosterone: 51 ng/dL
Renin Activity: 0.19 ng/mL/h — ABNORMAL LOW (ref 0.25–5.82)

## 2018-01-21 LAB — MAGNESIUM: Magnesium: 2 mg/dL (ref 1.5–2.5)

## 2018-01-21 LAB — CORTISOL-AM, BLOOD: Cortisol - AM: 13 ug/dL

## 2018-01-22 ENCOUNTER — Other Ambulatory Visit: Payer: Self-pay | Admitting: "Endocrinology

## 2018-01-22 DIAGNOSIS — E269 Hyperaldosteronism, unspecified: Secondary | ICD-10-CM

## 2018-01-28 ENCOUNTER — Other Ambulatory Visit: Payer: Self-pay

## 2018-01-28 DIAGNOSIS — E269 Hyperaldosteronism, unspecified: Secondary | ICD-10-CM

## 2018-01-31 ENCOUNTER — Ambulatory Visit: Payer: 59 | Admitting: Urology

## 2018-01-31 DIAGNOSIS — N46023 Azoospermia due to obstruction of efferent ducts: Secondary | ICD-10-CM

## 2018-01-31 DIAGNOSIS — N471 Phimosis: Secondary | ICD-10-CM | POA: Diagnosis not present

## 2018-01-31 DIAGNOSIS — N5201 Erectile dysfunction due to arterial insufficiency: Secondary | ICD-10-CM

## 2018-01-31 DIAGNOSIS — E269 Hyperaldosteronism, unspecified: Secondary | ICD-10-CM | POA: Diagnosis not present

## 2018-02-05 LAB — CREATININE, URINE, 24 HOUR: CREATININE 24H UR: 1.92 g/(24.h) (ref 0.50–2.15)

## 2018-02-05 LAB — ALDOSTERONE, URINE, 24 HOUR
Aldosterone, 24H Ur: 39.3 mcg/24 h
Creatinine, Urine mg/day-VMAUR: 1.88 g/(24.h) (ref 0.50–2.15)
VOLUME, URINE-ALDU: 3000 mL

## 2018-02-06 ENCOUNTER — Ambulatory Visit: Payer: 59 | Admitting: "Endocrinology

## 2018-02-11 ENCOUNTER — Other Ambulatory Visit: Payer: Self-pay | Admitting: "Endocrinology

## 2018-02-11 DIAGNOSIS — E269 Hyperaldosteronism, unspecified: Secondary | ICD-10-CM

## 2018-02-20 ENCOUNTER — Ambulatory Visit (INDEPENDENT_AMBULATORY_CARE_PROVIDER_SITE_OTHER): Payer: 59 | Admitting: Otolaryngology

## 2018-02-20 DIAGNOSIS — H900 Conductive hearing loss, bilateral: Secondary | ICD-10-CM

## 2018-02-20 DIAGNOSIS — H6123 Impacted cerumen, bilateral: Secondary | ICD-10-CM

## 2018-03-05 ENCOUNTER — Encounter: Payer: Self-pay | Admitting: Orthopedic Surgery

## 2018-03-05 ENCOUNTER — Ambulatory Visit (INDEPENDENT_AMBULATORY_CARE_PROVIDER_SITE_OTHER): Payer: 59

## 2018-03-05 ENCOUNTER — Ambulatory Visit (INDEPENDENT_AMBULATORY_CARE_PROVIDER_SITE_OTHER): Payer: 59 | Admitting: Orthopedic Surgery

## 2018-03-05 VITALS — BP 131/82 | HR 78 | Ht 69.0 in | Wt 180.0 lb

## 2018-03-05 DIAGNOSIS — M1712 Unilateral primary osteoarthritis, left knee: Secondary | ICD-10-CM | POA: Diagnosis not present

## 2018-03-05 DIAGNOSIS — M23322 Other meniscus derangements, posterior horn of medial meniscus, left knee: Secondary | ICD-10-CM | POA: Diagnosis not present

## 2018-03-05 DIAGNOSIS — M25562 Pain in left knee: Secondary | ICD-10-CM

## 2018-03-05 NOTE — Progress Notes (Addendum)
Progress Note   Patient ID: Devin Wilson, male   DOB: 06-24-1965, 52 y.o.   MRN: [id number]   Chief Complaint  Patient presents with  . Knee Pain    left    HPI The patient presents for evaluation of left knee pain.  The patient was climbing the steps at the hospital felt acute pain in his left knee.  He then tried to play soccer and the knee started to swell to the point where he could not bend or straighten it fully.  He cannot take NSAIDs secondary to GI intolerance and he is tried Tylenol ice rest and activity modification presents for evaluation of his left knee pain  Location left knee Duration 2-1/2 weeks Quality dull Severity moderate Associated with swelling loss of motion  Review of Systems  Respiratory: Negative for shortness of breath.   Cardiovascular: Negative for chest pain.  Skin: Negative for itching and rash.   Current Meds  Medication Sig  . amLODipine (NORVASC) 10 MG tablet   . cloNIDine (CATAPRES) 0.1 MG tablet Take 0.2 mg by mouth at bedtime.  Marland Kitchen labetalol (NORMODYNE) 300 MG tablet Take 900 mg by mouth 2 (two) times daily.  Marland Kitchen lisinopril (PRINIVIL,ZESTRIL) 40 MG tablet Take 40 mg by mouth daily.  . potassium chloride SA (K-DUR,KLOR-CON) 20 MEQ tablet Take 80 mEq by mouth daily.  . sildenafil (VIAGRA) 50 MG tablet TK 1 T PO PRN. MDD 2 TS  . triamterene-hydrochlorothiazide (MAXZIDE-25) 37.5-25 MG per tablet Take 1 tablet by mouth daily.    Past Medical History:  Diagnosis Date  . Angina   . Bradycardia, sinus   . Chest pain    a. 3.15.2013 Cath: Minor Irregs  . GERD (gastroesophageal reflux disease)   . Hypertension   . Migraines   . Thalassemia minor      Allergies  Allergen Reactions  . Ciprofloxacin Swelling    All flouroquinolones/Facial swelling  . Levaquin [Levofloxacin In D5w]      BP 131/82   Pulse 78   Ht 5\' 9"  (1.753 m)   Wt 180 lb (81.6 kg)   BMI 26.58 kg/m    Physical Exam General appearance normal Oriented x3  normal Mood pleasant affect normal Gait limping   Ortho Exam Right knee and lower extremity Inspection and palpation revealed no abnormalities Range of motion is full No instability was detected on stress testing Muscle tone and strength was normal without tremor Skin was warm dry and intact Good pulse and temperature were noted in the extremity Sensation revealed no abnormalities to light touch  Left knee normal alignment medial joint line tenderness large effusion patient cannot extend the knee fully only flexes to 90 degrees no instability was detected muscle tone and strength were normal without tremor skin showed no redness or erythema distal pulses and temperature were normal there were no sensory abnormalities.  Provocative test for medial meniscal tear were positive with positive McMurray sign and pain and click on the medial joint line   MEDICAL DECISION MAKING   Imaging:  Reason orthopedics and sports medicine plain films today show arthritis of the knee mild to the report   Encounter Diagnoses  Name Primary?  . Acute pain of left knee   . Derangement of posterior horn of medial meniscus of left knee Yes  . Primary osteoarthritis of left knee      PLAN: (RX., injection, surgery,frx,mri/ct, XR 2 body ares) Aspiration injection left knee revealed 57 cc of clear yellow fluid  Procedure note injection and aspiration left knee joint  Verbal consent was obtained to aspirate and inject the left knee joint   Timeout was completed to confirm the site of aspiration and injection  An 18-gauge needle was used to aspirate the left knee joint from a suprapatellar lateral approach.  The medications used were 40 mg of Depo-Medrol and 1% lidocaine 3 cc  Anesthesia was provided by ethyl chloride and the skin was prepped with alcohol.  After cleaning the skin with alcohol an 18-gauge needle was used to aspirate the right knee joint.  We obtained 57 cc of fluid  We followed  this by injection of 40 mg of Depo-Medrol and 3 cc 1% lidocaine.  There were no complications. A sterile bandage was applied.   The patient has a suspected meniscal tear medial joint line and will require surgical intervention with preop MRI to confirm  No orders of the defined types were placed in this encounter.  11:32 AM 03/05/2018

## 2018-03-06 ENCOUNTER — Telehealth: Payer: Self-pay | Admitting: Radiology

## 2018-03-06 NOTE — Telephone Encounter (Signed)
I did get the MRI approved for Dr Freddie Apley knee He will have the scan on Oct 17th, wants to know if you will call him with the report

## 2018-03-13 ENCOUNTER — Ambulatory Visit (HOSPITAL_COMMUNITY)
Admission: RE | Admit: 2018-03-13 | Discharge: 2018-03-13 | Disposition: A | Discharge status: Home / Self Care | Payer: 59 | Source: Ambulatory Visit | Attending: Orthopedic Surgery | Admitting: Orthopedic Surgery

## 2018-03-13 DIAGNOSIS — S83282A Other tear of lateral meniscus, current injury, left knee, initial encounter: Secondary | ICD-10-CM | POA: Diagnosis not present

## 2018-03-13 DIAGNOSIS — S83232A Complex tear of medial meniscus, current injury, left knee, initial encounter: Secondary | ICD-10-CM | POA: Insufficient documentation

## 2018-03-13 DIAGNOSIS — M25562 Pain in left knee: Secondary | ICD-10-CM | POA: Insufficient documentation

## 2018-03-13 DIAGNOSIS — M949 Disorder of cartilage, unspecified: Secondary | ICD-10-CM | POA: Diagnosis not present

## 2018-03-14 ENCOUNTER — Ambulatory Visit (HOSPITAL_COMMUNITY)
Admission: RE | Admit: 2018-03-14 | Discharge: 2018-03-14 | Disposition: A | Discharge status: Home / Self Care | Payer: 59 | Source: Ambulatory Visit | Attending: "Endocrinology | Admitting: "Endocrinology

## 2018-03-14 DIAGNOSIS — J90 Pleural effusion, not elsewhere classified: Secondary | ICD-10-CM | POA: Insufficient documentation

## 2018-03-14 DIAGNOSIS — E269 Hyperaldosteronism, unspecified: Secondary | ICD-10-CM | POA: Insufficient documentation

## 2018-03-14 DIAGNOSIS — N281 Cyst of kidney, acquired: Secondary | ICD-10-CM | POA: Insufficient documentation

## 2018-03-14 DIAGNOSIS — I517 Cardiomegaly: Secondary | ICD-10-CM | POA: Diagnosis not present

## 2018-03-15 ENCOUNTER — Other Ambulatory Visit: Payer: Self-pay | Admitting: Orthopedic Surgery

## 2018-03-19 ENCOUNTER — Telehealth: Payer: Self-pay | Admitting: Radiology

## 2018-03-19 NOTE — Telephone Encounter (Signed)
Submitted prior authorization for the knee scaope CPT 29881   The notification/prior authorization case information was transmitted on 03/19/2018 at 11:51 AM CDT. The notification/prior authorization reference number is [id number].

## 2018-03-19 NOTE — Patient Instructions (Signed)
Devin Wilson  03/19/2018     @PREFPERIOPPHARMACY @   Your procedure is scheduled on  03/24/2018  Report to [facility] at  63  A.M.  Call this number if you have problems the morning of surgery:  [phone]   Remember:  Do not eat or drink after midnight.                     Take these medicines the morning of surgery with A SIP OF WATER  Labetolol, lisinopril.    Do not wear jewelry, make-up or nail polish.  Do not wear lotions, powders, or perfumes, or deodorant.  Do not shave 48 hours prior to surgery.  Men may shave face and neck.  Do not bring valuables to the hospital.  [facility] is not responsible for any belongings or valuables.  Contacts, dentures or bridgework may not be worn into surgery.  Leave your suitcase in the car.  After surgery it may be brought to your room.  For patients admitted to the hospital, discharge time will be determined by your treatment team.  Patients discharged the day of surgery will not be allowed to drive home.   Name and phone number of your driver:   family Special instructions:  None  Please read over the following fact sheets that you were given. Anesthesia Post-op Instructions and Care and Recovery After Surgery      Knee Ligament Injury, Arthroscopy Arthroscopy is a surgical technique in which your health care provider examines your knee through a small, pencil-sized telescope (arthroscope). Often, repairs to injured ligaments can be done with instruments in the arthroscope. Arthroscopy is less invasive than open-knee surgery. Tell a health care provider about:  Any allergies you have.  All medicines you are taking, including vitamins, herbs, eye drops, creams, and over-the-counter medicines.  Any problems you or family members have had with anesthetic medicines.  Any blood disorders you have.  Any surgeries you have had.  Any medical conditions you have. What are the risks? Generally, this is a  safe procedure. However, as with any procedure, problems can occur. Possible problems include:  Infection.  Bleeding.  Stiffness.  What happens before the procedure?  Ask your health care provider about changing or stopping any regular medicines. Avoid taking aspirin or blood thinners as directed by your health care provider.  Do not eat or drink anything after midnight the night before surgery.  If you smoke, do not smoke for at least 2 weeks before your surgery.  Do not drink alcohol starting the day before your surgery.  Let your health care provider know if you develop a cold or any infection before your surgery.  Arrange for someone to drive you home after the surgery or after your hospital stay. Also arrange for someone to help you with activities during recovery. What happens during the procedure?  Small monitors will be put on your body. They are used to check your heart, blood pressure, and oxygen levels.  An IV access tube will be put into one of your veins. Medicine will be able to flow directly into your body through this IV tube.  You might be given a medicine to help you relax (sedative).  You will be given a medicine that makes you go to sleep (general anesthetic), and a breathing tube will be placed into your lungs during the procedure.  Several small incisions are made in your  knee. Saline fluid is placed into one of the incisions to expand the knee and clear away any blood in the knee.  Your health care provider will insert the arthroscope to examine the injured knee.  During arthroscopy, your health care provider may find a partial or complete tear in a ligament.  Tools can be inserted through the other incisions to repair the injured ligaments.  The incisions are then closed with absorbable stitches and covered with dressings. What happens after the procedure?  You will be taken to the recovery area where you will be monitored.  When you are awake,  stable, and taking fluids without problems, you will be allowed to go home. This information is not intended to replace advice given to you by your health care provider. Make sure you discuss any questions you have with your health care provider. Document Released: 05/11/2000 Document Revised: 10/20/2015 Document Reviewed: 12/24/2012 Elsevier Interactive Patient Education  2017 [place].  Arthroscopic Knee Ligament Repair, Care After This sheet gives you information about how to care for yourself after your procedure. Your health care provider may also give you more specific instructions. If you have problems or questions, contact your health care provider. What can I expect after the procedure? After the procedure, it is common to have:  Pain in your knee.  Bruising and swelling on your knee, calf, and ankle for 3-4 days.  Fatigue.  Follow these instructions at home: If you have a brace or immobilizer:  Wear the brace or immobilizer as told by your health care provider. Remove it only as told by your health care provider.  Loosen the splint or immobilizer if your toes tingle, become numb, or turn cold and blue.  Keep the brace or immobilizer clean. Bathing  Do not take baths, swim, or use a hot tub until your health care provider approves. Ask your health care provider if you can take showers.  Keep your bandage (dressing) dry until your health care provider says that it can be removed. Cover it and your brace or immobilizer with a watertight covering when you take a shower. Incision care  Follow instructions from your health care provider about how to take care of your incision. Make sure you: ? Wash your hands with soap and water before you change your bandage (dressing). If soap and water are not available, use hand sanitizer. ? Change your dressing as told by your health care provider. ? Leave stitches (sutures), skin glue, or adhesive strips in place. These skin closures  may need to stay in place for 2 weeks or longer. If adhesive strip edges start to loosen and curl up, you may trim the loose edges. Do not remove adhesive strips completely unless your health care provider tells you to do that.  Check your incision area every day for signs of infection. Check for: ? More redness, swelling, or pain. ? More fluid or blood. ? Warmth. ? Pus or a bad smell. Managing pain, stiffness, and swelling  If directed, put ice on the affected area. ? If you have a removable brace or immobilizer, remove it as told by your health care provider. ? Put ice in a plastic bag. ? Place a towel between your skin and the bag or between your brace or immobilizer and the bag. ? Leave the ice on for 20 minutes, 2-3 times a day.  Move your toes often to avoid stiffness and to lessen swelling.  Raise (elevate) the injured area above the level  of your heart while you are sitting or lying down. Driving  Do not drive until your health care provider approves. If you have a brace or immobilizer on your leg, ask your health care provider when it is safe for you to drive.  Do not drive or use heavy machinery while taking prescription pain medicine. Activity  Rest as directed. Ask your health care provider what activities are safe for you.  Do physical therapy exercises as told by your health care provider. Physical therapy will help you regain strength and motion in your knee.  Follow instructions from your health care provider about: ? When you may start motion exercises. ? When you may start riding a stationary bike and doing other low-impact activities. ? When you may start to jog and do other high-impact activities. Safety  Do not use the injured limb to support your body weight until your health care provider says that you can. Use crutches as told by your health care provider. General instructions  Do not use any products that contain nicotine or tobacco, such as cigarettes  and e-cigarettes. These can delay bone healing. If you need help quitting, ask your health care provider.  To prevent or treat constipation while you are taking prescription pain medicine, your health care provider may recommend that you: ? Drink enough fluid to keep your urine clear or pale yellow. ? Take over-the-counter or prescription medicines. ? Eat foods that are high in fiber, such as fresh fruits and vegetables, whole grains, and beans. ? Limit foods that are high in fat and processed sugars, such as fried and sweet foods.  Take over-the-counter and prescription medicines only as told by your health care provider.  Keep all follow-up visits as told by your health care provider. This is important. Contact a health care provider if:  You have more redness, swelling, or pain around an incision.  You have more fluid or blood coming from an incision.  Your incision feels warm to the touch.  You have a fever.  You have pain or swelling in your knee, and it gets worse.  You have pain that does not get better with medicine. Get help right away if:  You have trouble breathing.  You have pus or a bad smell coming from an incision.  You have numbness and tingling near the knee joint. Summary  After the procedure, it is common to have knee pain with bruising and swelling on your knee, calf, and ankle.  Icing your knee and raising your leg above the level of your heart will help control the pain and the swelling.  Do physical therapy exercises as told by your health care provider. Physical therapy will help you regain strength and motion in your knee. This information is not intended to replace advice given to you by your health care provider. Make sure you discuss any questions you have with your health care provider. Document Released: 03/04/2013 Document Revised: 05/08/2016 Document Reviewed: 05/08/2016 Elsevier Interactive Patient Education  2017 [place]  Anesthesia, Adult General anesthesia is the use of medicines to make a person "go to sleep" (be unconscious) for a medical procedure. General anesthesia is often recommended when a procedure:  Is long.  Requires you to be still or in an unusual position.  Is major and can cause you to lose blood.  Is impossible to do without general anesthesia.  The medicines used for general anesthesia are called general anesthetics. In addition to making you sleep,  the medicines:  Prevent pain.  Control your blood pressure.  Relax your muscles.  Tell a health care provider about:  Any allergies you have.  All medicines you are taking, including vitamins, herbs, eye drops, creams, and over-the-counter medicines.  Any problems you or family members have had with anesthetic medicines.  Types of anesthetics you have had in the past.  Any bleeding disorders you have.  Any surgeries you have had.  Any medical conditions you have.  Any history of heart or lung conditions, such as heart failure, sleep apnea, or chronic obstructive pulmonary disease (COPD).  Whether you are pregnant or may be pregnant.  Whether you use tobacco, alcohol, marijuana, or street drugs.  Any history of Armed forces logistics/support/administrative officer.  Any history of depression or anxiety. What are the risks? Generally, this is a safe procedure. However, problems may occur, including:  Allergic reaction to anesthetics.  Lung and heart problems.  Inhaling food or liquids from your stomach into your lungs (aspiration).  Injury to nerves.  Waking up during your procedure and being unable to move (rare).  Extreme agitation or a state of mental confusion (delirium) when you wake up from the anesthetic.  Air in the bloodstream, which can lead to stroke.  These problems are more likely to develop if you are having a major surgery or if you have an advanced medical condition. You can prevent some of these complications by answering all of  your health care provider's questions thoroughly and by following all pre-procedure instructions. General anesthesia can cause side effects, including:  Nausea or vomiting  A sore throat from the breathing tube.  Feeling cold or shivery.  Feeling tired, washed out, or achy.  Sleepiness or drowsiness.  Confusion or agitation.  What happens before the procedure? Staying hydrated Follow instructions from your health care provider about hydration, which may include:  Up to 2 hours before the procedure - you may continue to drink clear liquids, such as water, clear fruit juice, black coffee, and plain tea.  Eating and drinking restrictions Follow instructions from your health care provider about eating and drinking, which may include:  8 hours before the procedure - stop eating heavy meals or foods such as meat, fried foods, or fatty foods.  6 hours before the procedure - stop eating light meals or foods, such as toast or cereal.  6 hours before the procedure - stop drinking milk or drinks that contain milk.  2 hours before the procedure - stop drinking clear liquids.  Medicines  Ask your health care provider about: ? Changing or stopping your regular medicines. This is especially important if you are taking diabetes medicines or blood thinners. ? Taking medicines such as aspirin and ibuprofen. These medicines can thin your blood. Do not take these medicines before your procedure if your health care provider instructs you not to. ? Taking new dietary supplements or medicines. Do not take these during the week before your procedure unless your health care provider approves them.  If you are told to take a medicine or to continue taking a medicine on the day of the procedure, take the medicine with sips of water. General instructions   Ask if you will be going home the same day, the following day, or after a longer hospital stay. ? Plan to have someone take you home. ? Plan to  have someone stay with you for the first 24 hours after you leave the hospital or clinic.  For 3-6 weeks before the  procedure, try not to use any tobacco products, such as cigarettes, chewing tobacco, and e-cigarettes.  You may brush your teeth on the morning of the procedure, but make sure to spit out the toothpaste. What happens during the procedure?  You will be given anesthetics through a mask and through an IV tube in one of your veins.  You may receive medicine to help you relax (sedative).  As soon as you are asleep, a breathing tube may be used to help you breathe.  An anesthesia specialist will stay with you throughout the procedure. He or she will help keep you comfortable and safe by continuing to give you medicines and adjusting the amount of medicine that you get. He or she will also watch your blood pressure, pulse, and oxygen levels to make sure that the anesthetics do not cause any problems.  If a breathing tube was used to help you breathe, it will be removed before you wake up. The procedure may vary among health care providers and hospitals. What happens after the procedure?  You will wake up, often slowly, after the procedure is complete, usually in a recovery area.  Your blood pressure, heart rate, breathing rate, and blood oxygen level will be monitored until the medicines you were given have worn off.  You may be given medicine to help you calm down if you feel anxious or agitated.  If you will be going home the same day, your health care provider may check to make sure you can stand, drink, and urinate.  Your health care providers will treat your pain and side effects before you go home.  Do not drive for 24 hours if you received a sedative.  You may: ? Feel nauseous and vomit. ? Have a sore throat. ? Have mental slowness. ? Feel cold or shivery. ? Feel sleepy. ? Feel tired. ? Feel sore or achy, even in parts of your body where you did not have  surgery. This information is not intended to replace advice given to you by your health care provider. Make sure you discuss any questions you have with your health care provider. Document Released: 08/21/2007 Document Revised: 10/25/2015 Document Reviewed: 04/28/2015 Elsevier Interactive Patient Education  2018 [place] Anesthesia, Adult, Care After These instructions provide you with information about caring for yourself after your procedure. Your health care provider may also give you more specific instructions. Your treatment has been planned according to current medical practices, but problems sometimes occur. Call your health care provider if you have any problems or questions after your procedure. What can I expect after the procedure? After the procedure, it is common to have:  Vomiting.  A sore throat.  Mental slowness.  It is common to feel:  Nauseous.  Cold or shivery.  Sleepy.  Tired.  Sore or achy, even in parts of your body where you did not have surgery.  Follow these instructions at home: For at least 24 hours after the procedure:  Do not: ? Participate in activities where you could fall or become injured. ? Drive. ? Use heavy machinery. ? Drink alcohol. ? Take sleeping pills or medicines that cause drowsiness. ? Make important decisions or sign legal documents. ? Take care of children on your own.  Rest. Eating and drinking  If you vomit, drink water, juice, or soup when you can drink without vomiting.  Drink enough fluid to keep your urine clear or pale yellow.  Make sure you have little or no nausea  before eating solid foods.  Follow the diet recommended by your health care provider. General instructions  Have a responsible adult stay with you until you are awake and alert.  Return to your normal activities as told by your health care provider. Ask your health care provider what activities are safe for you.  Take over-the-counter  and prescription medicines only as told by your health care provider.  If you smoke, do not smoke without supervision.  Keep all follow-up visits as told by your health care provider. This is important. Contact a health care provider if:  You continue to have nausea or vomiting at home, and medicines are not helpful.  You cannot drink fluids or start eating again.  You cannot urinate after 8-12 hours.  You develop a skin rash.  You have fever.  You have increasing redness at the site of your procedure. Get help right away if:  You have difficulty breathing.  You have chest pain.  You have unexpected bleeding.  You feel that you are having a life-threatening or urgent problem. This information is not intended to replace advice given to you by your health care provider. Make sure you discuss any questions you have with your health care provider. Document Released: 08/20/2000 Document Revised: 10/17/2015 Document Reviewed: 04/28/2015 Elsevier Interactive Patient Education  [employer].

## 2018-03-20 ENCOUNTER — Encounter (HOSPITAL_COMMUNITY): Payer: Self-pay

## 2018-03-20 ENCOUNTER — Other Ambulatory Visit: Payer: Self-pay

## 2018-03-20 ENCOUNTER — Telehealth: Payer: Self-pay | Admitting: Radiology

## 2018-03-20 ENCOUNTER — Encounter (HOSPITAL_COMMUNITY)
Admission: RE | Admit: 2018-03-20 | Discharge: 2018-03-20 | Disposition: A | Discharge status: Home / Self Care | Payer: 59 | Source: Ambulatory Visit | Attending: Orthopedic Surgery | Admitting: Orthopedic Surgery

## 2018-03-20 ENCOUNTER — Ambulatory Visit (HOSPITAL_COMMUNITY)
Admission: RE | Admit: 2018-03-20 | Discharge: 2018-03-20 | Disposition: A | Discharge status: Home / Self Care | Payer: 59 | Source: Ambulatory Visit | Attending: Orthopedic Surgery | Admitting: Orthopedic Surgery

## 2018-03-20 DIAGNOSIS — Z01818 Encounter for other preprocedural examination: Secondary | ICD-10-CM | POA: Diagnosis not present

## 2018-03-20 DIAGNOSIS — I517 Cardiomegaly: Secondary | ICD-10-CM | POA: Insufficient documentation

## 2018-03-20 HISTORY — DX: Sleep apnea, unspecified: G47.30

## 2018-03-20 LAB — CBC WITH DIFFERENTIAL/PLATELET
ABS IMMATURE GRANULOCYTES: 0.01 10*3/uL (ref 0.00–0.07)
BASOS PCT: 1 %
Basophils Absolute: 0 10*3/uL (ref 0.0–0.1)
EOS PCT: 3 %
Eosinophils Absolute: 0.1 10*3/uL (ref 0.0–0.5)
HEMATOCRIT: 36.2 % — AB (ref 39.0–52.0)
HEMOGLOBIN: 11 g/dL — AB (ref 13.0–17.0)
IMMATURE GRANULOCYTES: 0 %
LYMPHS PCT: 24 %
Lymphs Abs: 0.9 10*3/uL (ref 0.7–4.0)
MCH: 23.7 pg — ABNORMAL LOW (ref 26.0–34.0)
MCHC: 30.4 g/dL (ref 30.0–36.0)
MCV: 77.8 fL — AB (ref 80.0–100.0)
MONO ABS: 0.4 10*3/uL (ref 0.1–1.0)
MONOS PCT: 10 %
NEUTROS ABS: 2.3 10*3/uL (ref 1.7–7.7)
Neutrophils Relative %: 62 %
Platelets: 185 10*3/uL (ref 150–400)
RBC: 4.65 MIL/uL (ref 4.22–5.81)
RDW: 17.5 % — AB (ref 11.5–15.5)
WBC: 3.7 10*3/uL — AB (ref 4.0–10.5)
nRBC: 0 % (ref 0.0–0.2)

## 2018-03-20 LAB — BASIC METABOLIC PANEL
Anion gap: 7 (ref 5–15)
BUN: 15 mg/dL (ref 6–20)
CO2: 30 mmol/L (ref 22–32)
CREATININE: 1.39 mg/dL — AB (ref 0.61–1.24)
Calcium: 9.1 mg/dL (ref 8.9–10.3)
Chloride: 104 mmol/L (ref 98–111)
GFR calc Af Amer: >60 mL/min (ref >60)
GFR, EST NON AFRICAN AMERICAN: 57 mL/min — AB (ref >60)
GLUCOSE: 81 mg/dL (ref 70–99)
POTASSIUM: 2.9 mmol/L — AB (ref 3.5–5.1)
SODIUM: 141 mmol/L (ref 135–145)

## 2018-03-20 NOTE — Telephone Encounter (Signed)
-----   Message from [name], MD sent at 03/20/2018  2:51 PM EDT ----- K. Dur 20 mEq 3 times daily #12

## 2018-03-20 NOTE — Pre-Procedure Instructions (Signed)
Potassium of 2.9 routed to Dr Aline Brochure.

## 2018-03-20 NOTE — Telephone Encounter (Signed)
Called him to advise he has plenty of K and will use tid

## 2018-03-24 ENCOUNTER — Ambulatory Visit (HOSPITAL_COMMUNITY): Payer: 59 | Admitting: Anesthesiology

## 2018-03-24 ENCOUNTER — Ambulatory Visit (HOSPITAL_COMMUNITY)
Admission: RE | Admit: 2018-03-24 | Discharge: 2018-03-24 | Disposition: A | Discharge status: Home / Self Care | Payer: 59 | Source: Ambulatory Visit | Attending: Orthopedic Surgery | Admitting: Orthopedic Surgery

## 2018-03-24 ENCOUNTER — Encounter (HOSPITAL_COMMUNITY)
Admission: RE | Disposition: A | Discharge status: Home / Self Care | Payer: Self-pay | Source: Ambulatory Visit | Attending: Orthopedic Surgery

## 2018-03-24 DIAGNOSIS — Z79899 Other long term (current) drug therapy: Secondary | ICD-10-CM | POA: Insufficient documentation

## 2018-03-24 DIAGNOSIS — I10 Essential (primary) hypertension: Secondary | ICD-10-CM | POA: Insufficient documentation

## 2018-03-24 DIAGNOSIS — G4730 Sleep apnea, unspecified: Secondary | ICD-10-CM | POA: Diagnosis not present

## 2018-03-24 DIAGNOSIS — M23204 Derangement of unspecified medial meniscus due to old tear or injury, left knee: Secondary | ICD-10-CM

## 2018-03-24 DIAGNOSIS — S83242A Other tear of medial meniscus, current injury, left knee, initial encounter: Secondary | ICD-10-CM | POA: Insufficient documentation

## 2018-03-24 DIAGNOSIS — M949 Disorder of cartilage, unspecified: Secondary | ICD-10-CM | POA: Diagnosis not present

## 2018-03-24 DIAGNOSIS — M1712 Unilateral primary osteoarthritis, left knee: Secondary | ICD-10-CM | POA: Diagnosis not present

## 2018-03-24 DIAGNOSIS — X58XXXA Exposure to other specified factors, initial encounter: Secondary | ICD-10-CM | POA: Diagnosis not present

## 2018-03-24 DIAGNOSIS — M23301 Other meniscus derangements, unspecified lateral meniscus, left knee: Secondary | ICD-10-CM | POA: Diagnosis not present

## 2018-03-24 DIAGNOSIS — D563 Thalassemia minor: Secondary | ICD-10-CM | POA: Diagnosis not present

## 2018-03-24 DIAGNOSIS — S83282A Other tear of lateral meniscus, current injury, left knee, initial encounter: Secondary | ICD-10-CM | POA: Diagnosis not present

## 2018-03-24 DIAGNOSIS — M659 Synovitis and tenosynovitis, unspecified: Secondary | ICD-10-CM | POA: Diagnosis not present

## 2018-03-24 HISTORY — PX: KNEE ARTHROSCOPY WITH MEDIAL MENISECTOMY: SHX5651

## 2018-03-24 SURGERY — ARTHROSCOPY, KNEE, WITH MEDIAL MENISCECTOMY
Anesthesia: Spinal | Site: Knee | Laterality: Left

## 2018-03-24 MED ORDER — SODIUM CHLORIDE 0.9 % IR SOLN
Status: DC | PRN
  Administered 2018-03-24: 4 mL

## 2018-03-24 MED ORDER — CHLORHEXIDINE GLUCONATE 4 % EX LIQD: 60.0000 mL | Freq: Once | CUTANEOUS | Status: DC

## 2018-03-24 MED ORDER — MIDAZOLAM HCL 2 MG/2ML IJ SOLN
INTRAMUSCULAR | Status: AC
  Filled 2018-03-24: qty 2

## 2018-03-24 MED ORDER — PROPOFOL 500 MG/50ML IV EMUL
INTRAVENOUS | Status: DC | PRN
  Administered 2018-03-24: 75 ug/kg/min via INTRAVENOUS

## 2018-03-24 MED ORDER — EPINEPHRINE PF 1 MG/ML IJ SOLN
INTRAMUSCULAR | Status: AC
  Filled 2018-03-24: qty 1

## 2018-03-24 MED ORDER — BUPIVACAINE-EPINEPHRINE (PF) 0.25% -1:200000 IJ SOLN
INTRAMUSCULAR | Status: AC
  Filled 2018-03-24: qty 60

## 2018-03-24 MED ORDER — BUPIVACAINE IN DEXTROSE 0.75-8.25 % IT SOLN
INTRATHECAL | Status: DC | PRN
  Administered 2018-03-24: 1.7 mL via INTRATHECAL

## 2018-03-24 MED ORDER — FENTANYL CITRATE (PF) 100 MCG/2ML IJ SOLN
INTRAMUSCULAR | Status: AC
  Filled 2018-03-24: qty 2

## 2018-03-24 MED ORDER — HYDROCODONE-ACETAMINOPHEN 5-325 MG PO TABS: 1.0000 | ORAL_TABLET | ORAL | 0 refills | Status: AC | PRN

## 2018-03-24 MED ORDER — FENTANYL CITRATE (PF) 100 MCG/2ML IJ SOLN
INTRAMUSCULAR | Status: DC | PRN
  Administered 2018-03-24: 25 ug via INTRAVENOUS

## 2018-03-24 MED ORDER — LACTATED RINGERS IV SOLN
INTRAVENOUS | Status: DC
  Administered 2018-03-24: 11:00:00 via INTRAVENOUS

## 2018-03-24 MED ORDER — PROMETHAZINE HCL 25 MG/ML IJ SOLN: 6.2500 mg | INTRAMUSCULAR | Status: DC | PRN

## 2018-03-24 MED ORDER — HYDROMORPHONE HCL 1 MG/ML IJ SOLN: 0.2500 mg | INTRAMUSCULAR | Status: DC | PRN

## 2018-03-24 MED ORDER — BUPIVACAINE-EPINEPHRINE (PF) 0.25% -1:200000 IJ SOLN
INTRAMUSCULAR | Status: DC | PRN
  Administered 2018-03-24: 60 mL

## 2018-03-24 MED ORDER — BUPIVACAINE-EPINEPHRINE (PF) 0.5% -1:200000 IJ SOLN: INTRAMUSCULAR | Status: DC | PRN

## 2018-03-24 MED ORDER — BUPIVACAINE IN DEXTROSE 0.75-8.25 % IT SOLN: INTRATHECAL | Status: DC | PRN

## 2018-03-24 MED ORDER — HYDROCODONE-ACETAMINOPHEN 7.5-325 MG PO TABS: 1.0000 | ORAL_TABLET | Freq: Once | ORAL | Status: DC | PRN

## 2018-03-24 MED ORDER — POVIDONE-IODINE 10 % EX SWAB: 2.0000 "application " | Freq: Once | CUTANEOUS | Status: DC

## 2018-03-24 MED ORDER — EPINEPHRINE PF 1 MG/ML IJ SOLN
INTRAMUSCULAR | Status: AC
  Filled 2018-03-24: qty 4

## 2018-03-24 MED ORDER — MIDAZOLAM HCL 2 MG/2ML IJ SOLN: 0.5000 mg | Freq: Once | INTRAMUSCULAR | Status: DC | PRN

## 2018-03-24 MED ORDER — CEFAZOLIN SODIUM-DEXTROSE 2-4 GM/100ML-% IV SOLN
2.0000 g | INTRAVENOUS | Status: AC
  Administered 2018-03-24: 2 g via INTRAVENOUS

## 2018-03-24 MED ORDER — CEFAZOLIN SODIUM-DEXTROSE 2-4 GM/100ML-% IV SOLN
INTRAVENOUS | Status: AC
  Filled 2018-03-24: qty 100

## 2018-03-24 MED ORDER — MIDAZOLAM HCL 5 MG/5ML IJ SOLN
INTRAMUSCULAR | Status: DC | PRN
  Administered 2018-03-24 (×2): 1 mg via INTRAVENOUS

## 2018-03-24 SURGICAL SUPPLY — 59 items
ARTHROWAND PARAGON T2 (SURGICAL WAND)
BANDAGE ELASTIC 6 LF NS (GAUZE/BANDAGES/DRESSINGS) ×2 IMPLANT
BANDAGE ELASTIC 6 VELCRO ST LF (GAUZE/BANDAGES/DRESSINGS) ×1 IMPLANT
BANDAGE GAUZE ELAST BULKY 4 IN (GAUZE/BANDAGES/DRESSINGS) ×1 IMPLANT
BLADE AGGRESSIVE PLUS 4.0 (BLADE) ×2 IMPLANT
BLADE SURG SZ11 CARB STEEL (BLADE) ×2 IMPLANT
BNDG CMPR MED 5X6 ELC HKLP NS (GAUZE/BANDAGES/DRESSINGS) ×1
CHLORAPREP W/TINT 26ML (MISCELLANEOUS) ×2 IMPLANT
CLOTH BEACON ORANGE TIMEOUT ST (SAFETY) ×2 IMPLANT
COOLER CRYO IC GRAV AND TUBE (ORTHOPEDIC SUPPLIES) ×2 IMPLANT
CUFF CRYO KNEE LG 20X31 COOLER (ORTHOPEDIC SUPPLIES) ×1 IMPLANT
CUFF CRYO KNEE18X23 MED (MISCELLANEOUS) IMPLANT
CUFF TOURNIQUET SINGLE 34IN LL (TOURNIQUET CUFF) ×1 IMPLANT
CUTTER ANGLED AGGR PLUS 4.0 (BURR) ×1 IMPLANT
CUTTER ANGLED DBL BITE 4.5 (BURR) IMPLANT
DECANTER SPIKE VIAL GLASS SM (MISCELLANEOUS) ×4 IMPLANT
GAUZE 4X4 16PLY RFD (DISPOSABLE) ×2 IMPLANT
GAUZE SPONGE 4X4 12PLY STRL (GAUZE/BANDAGES/DRESSINGS) ×2 IMPLANT
GAUZE SPONGE 4X4 16PLY XRAY LF (GAUZE/BANDAGES/DRESSINGS) ×2 IMPLANT
GAUZE XEROFORM 5X9 LF (GAUZE/BANDAGES/DRESSINGS) ×2 IMPLANT
GLOVE BIOGEL PI IND STRL 7.0 (GLOVE) ×2 IMPLANT
GLOVE BIOGEL PI INDICATOR 7.0 (GLOVE) ×2
GLOVE SKINSENSE NS SZ8.0 LF (GLOVE) ×1
GLOVE SKINSENSE STRL SZ8.0 LF (GLOVE) ×1 IMPLANT
GLOVE SS N UNI LF 8.5 STRL (GLOVE) ×2 IMPLANT
GOWN STRL REUS W/ TWL LRG LVL3 (GOWN DISPOSABLE) ×1 IMPLANT
GOWN STRL REUS W/TWL LRG LVL3 (GOWN DISPOSABLE) ×2
GOWN STRL REUS W/TWL XL LVL3 (GOWN DISPOSABLE) ×2 IMPLANT
HLDR LEG FOAM (MISCELLANEOUS) ×1 IMPLANT
IV NS IRRIG 3000ML ARTHROMATIC (IV SOLUTION) ×7 IMPLANT
KIT BLADEGUARD II DBL (SET/KITS/TRAYS/PACK) ×2 IMPLANT
KIT TURNOVER CYSTO (KITS) ×2 IMPLANT
LEG HOLDER FOAM (MISCELLANEOUS) ×1
MANIFOLD NEPTUNE II (INSTRUMENTS) ×2 IMPLANT
MARKER SKIN DUAL TIP RULER LAB (MISCELLANEOUS) ×2 IMPLANT
NDL HYPO 18GX1.5 BLUNT FILL (NEEDLE) ×1 IMPLANT
NDL HYPO 21X1.5 SAFETY (NEEDLE) ×1 IMPLANT
NDL SPNL 18GX3.5 QUINCKE PK (NEEDLE) ×1 IMPLANT
NEEDLE HYPO 18GX1.5 BLUNT FILL (NEEDLE) ×2 IMPLANT
NEEDLE HYPO 21X1.5 SAFETY (NEEDLE) ×2 IMPLANT
NEEDLE SPNL 18GX3.5 QUINCKE PK (NEEDLE) ×2 IMPLANT
NS IRRIG 1000ML POUR BTL (IV SOLUTION) ×2 IMPLANT
PACK ARTHRO LIMB DRAPE STRL (MISCELLANEOUS) ×2 IMPLANT
PAD ABD 5X9 TENDERSORB (GAUZE/BANDAGES/DRESSINGS) ×2 IMPLANT
PAD ABD 8X10 STRL (GAUZE/BANDAGES/DRESSINGS) ×1 IMPLANT
PAD ARMBOARD 7.5X6 YLW CONV (MISCELLANEOUS) ×2 IMPLANT
PADDING CAST COTTON 6X4 STRL (CAST SUPPLIES) ×2 IMPLANT
PROBE BIPOLAR 50 DEGREE SUCT (MISCELLANEOUS) ×1 IMPLANT
PROBE BIPOLAR ATHRO 135MM 90D (MISCELLANEOUS) IMPLANT
SET ARTHROSCOPY INST (INSTRUMENTS) ×2 IMPLANT
SET BASIN LINEN APH (SET/KITS/TRAYS/PACK) ×2 IMPLANT
SPONGE GAUZE 4X4 FOR O.R. (GAUZE/BANDAGES/DRESSINGS) ×1 IMPLANT
SUT ETHILON 3 0 FSL (SUTURE) ×2 IMPLANT
SYR 10ML LL (SYRINGE) ×2 IMPLANT
SYR 30ML LL (SYRINGE) ×2 IMPLANT
TUBE CONNECTING 12X1/4 (SUCTIONS) ×4 IMPLANT
TUBING ARTHRO INFLOW-ONLY STRL (TUBING) ×2 IMPLANT
WAND ARTHRO PARAGON T2 (SURGICAL WAND) IMPLANT
WHISKER CUTTER 4 (BUR) ×1 IMPLANT

## 2018-03-24 NOTE — Brief Op Note (Signed)
03/24/2018  2:15 PM  PATIENT:  Devin Wilson  52 y.o. male  PRE-OPERATIVE DIAGNOSIS:  Medial meniscus tear left knee  POST-OPERATIVE DIAGNOSIS: Medial meniscus tear left knee, lateral meniscus tear left knee, osteoarthritis severe.  PROCEDURE:  Procedure(s): KNEE ARTHROSCOPY WITH MEDIAL MENISECTOMY (Left) with partial medial meniscectomy and chondroplasty medial femoral condyle  Arthroscopic findings  tear posterior horn medial meniscus  free edge tear medial meniscus at the body and posterior horn junction  chondral flap tear medial femoral condyle  Osteoarthritis grade 3 chondral lesion lateral tibial plateau  grade 2 lesion lateral femoral condyle  with free edge tears of the lateral meniscus throughout its course including a posterior horn tear  Grade 4 chondral lesion trochlea  Synovitis moderate  SURGEON:  Surgeon(s) and Role:    * [name], MD - Primary  PHYSICIAN ASSISTANT:   ASSISTANTS: none   ANESTHESIA:   spinal  EBL:  None    BLOOD ADMINISTERED:none  DRAINS: none   LOCAL MEDICATIONS USED:  MARCAINE     SPECIMEN:  No Specimen  DISPOSITION OF SPECIMEN:  N/A  COUNTS:  YES  TOURNIQUET:  * Missing tourniquet times found for documented tourniquets in log: [id number] *  DICTATION: .Dragon Dictation  PLAN OF CARE: Discharge to home after PACU  PATIENT DISPOSITION:  PACU - hemodynamically stable.   Delay start of Pharmacological VTE agent (>24hrs) due to surgical blood loss or risk of bleeding: not applicable

## 2018-03-24 NOTE — Discharge Instructions (Signed)
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 Your doctor may also give you more specific instructions. Call your doctor if you have any problems or questions after your procedure. Follow these instructions at home: For at least 24 hours after the procedure:   Do not: ? Do activities where you could fall or get hurt (injured). ? Drive. ? Use heavy machinery. ? Drink alcohol. ? Take sleeping pills or medicines that make you sleepy (drowsy). ? Make important decisions. ? Sign legal documents. ? Take care of children on your own.  Rest. Eating and drinking  If you throw up (vomit), drink water, juice, or soup when you can drink without throwing up.  Make sure you do not feel like throwing up (are not nauseous) before you eat.  Follow the diet that is recommended by your doctor. General instructions  Have a responsible adult stay with you until you are awake and alert.  Take over-the-counter and prescription medicines only as told by your doctor.  If you smoke, do not smoke unless an adult is watching.  Keep all follow-up visits as told by your doctor. This is important. Contact a doctor if:  It has been more than one day since your procedure and you feel like throwing up.  It has been more than one day since your procedure and you throw up.  You have a rash. Get help right away if:  You have a fever.  You have a headache that lasts a long  time.  You have a very bad headache.  Your vision is blurry.  You see two of a single object (double vision).  You are dizzy or light-headed.  You faint.  Your arms or legs tingle, feel weak, or get numb.  You have trouble breathing.  You cannot pee (urinate). This information is not intended to replace advice given to you by your health care provider. Make sure you discuss any questions you have with your health care provider. Document Released: 09/05/2015 Document Revised: 01/05/2016 Document Reviewed: 09/05/2015 Elsevier Interactive Patient Education  [employer].

## 2018-03-24 NOTE — Interval H&P Note (Signed)
History and Physical Interval Note:  03/24/2018 12:48 PM  Devin Wilson  has presented today for surgery, with the diagnosis of Medial meniscus tear left knee  The various methods of treatment have been discussed with the patient and family. After consideration of risks, benefits and other options for treatment, the patient has consented to  Procedure(s): KNEE ARTHROSCOPY WITH MEDIAL MENISECTOMY (Left) as a surgical intervention .  The patient's history has been reviewed, patient examined, no change in status, stable for surgery.  I have reviewed the patient's chart and labs.  Questions were answered to the patient's satisfaction.     Arther Abbott

## 2018-03-24 NOTE — Anesthesia Procedure Notes (Signed)
Spinal  Patient location during procedure: OR Start time: 03/24/2018 12:56 PM End time: 03/24/2018 1:01 PM Staffing Anesthesiologist: Lenice Llamas, MD Performed: anesthesiologist  Spinal Block Patient position: sitting Prep: Betadine and BX3 Patient monitoring: heart rate, cardiac monitor, continuous pulse ox and blood pressure Approach: midline Location: L3-4 Injection technique: single-shot Needle Needle type: Sprotte  Needle gauge: 24 G Needle length: 10 cm Additional Notes Sitting aseptic local, L3-4, + CSF x 1 with 24g sprotte  , then 1.7 cc heavy Bupiv 0.75%., then supine -tolerated well supine after block

## 2018-03-24 NOTE — Anesthesia Postprocedure Evaluation (Signed)
Anesthesia Post Note  Patient: Devin Wilson  Procedure(s) Performed: KNEE ARTHROSCOPY WITH MEDIAL MENISECTOMY,LATERAL MENISECTOMY, CHONDROPLASTY MEDIAL FEMUR (Left Knee)  Patient location during evaluation: PACU Anesthesia Type: Spinal Level of consciousness: awake and alert and patient cooperative Pain management: satisfactory to patient Vital Signs Assessment: post-procedure vital signs reviewed and stable Respiratory status: spontaneous breathing Cardiovascular status: stable Postop Assessment: no apparent nausea or vomiting, patient able to bend at knees and spinal receding Anesthetic complications: no     Last Vitals:  Vitals:   03/24/18 1418 03/24/18 1430  BP:  (!) 138/96  Pulse:  68  Resp:  17  Temp: 36.5 C   SpO2: 100% 99%    Last Pain:  Vitals:   03/24/18 1430  TempSrc:   PainSc: 0-No pain                 Netanel Yannuzzi

## 2018-03-24 NOTE — Transfer of Care (Signed)
Immediate Anesthesia Transfer of Care Note  Patient: Devin Wilson  Procedure(s) Performed: KNEE ARTHROSCOPY WITH MEDIAL MENISECTOMY (Left Knee)  Patient Location: PACU  Anesthesia Type:Spinal  Level of Consciousness: awake, alert  and patient cooperative  Airway & Oxygen Therapy: Patient Spontanous Breathing  Post-op Assessment: Report given to RN and Post -op Vital signs reviewed and stable  Post vital signs: Reviewed and stable  Last Vitals:  Vitals Value Taken Time  BP    Temp    Pulse 53 03/24/2018  2:18 PM  Resp    SpO2 83 % 03/24/2018  2:18 PM  Vitals shown include unvalidated device data.  Last Pain:  Vitals:   03/24/18 1108  TempSrc: Oral  PainSc: 0-No pain      Patients Stated Pain Goal: 7 (59/74/16 3845)  Complications: No apparent anesthesia complications

## 2018-03-24 NOTE — Anesthesia Preprocedure Evaluation (Signed)
Anesthesia Evaluation  Patient identified by MRN, date of birth, ID band Patient awake  General Assessment Comment:Pt gives history of transient increases in CK which has been associated with MH. Denies any other anesthesia other than colonoscopy. Denies any FH of MH  Reviewed: Allergy & Precautions, NPO status , Patient's Chart, lab work & pertinent test results  Airway Mallampati: II  TM Distance: >3 FB Neck ROM: Full    Dental no notable dental hx.    Pulmonary sleep apnea and Continuous Positive Airway Pressure Ventilation ,    Pulmonary exam normal breath sounds clear to auscultation       Cardiovascular Exercise Tolerance: Good hypertension, Pt. on medications + angina Normal cardiovascular examI Rhythm:Regular Rate:Normal  Was cathed in 2013 States can exert himself without difficulty -states plays soccer. Denies any recent CP   Neuro/Psych  Headaches, negative psych ROS   GI/Hepatic Neg liver ROS, GERD  ,  Endo/Other  negative endocrine ROS  Renal/GU Renal InsufficiencyRenal disease  negative genitourinary   Musculoskeletal negative musculoskeletal ROS (+)   Abdominal   Peds negative pediatric ROS (+)  Hematology negative hematology ROS (+) anemia ,   Anesthesia Other Findings   Reproductive/Obstetrics negative OB ROS                             Anesthesia Physical Anesthesia Plan  ASA: II  Anesthesia Plan: Spinal   Post-op Pain Management:    Induction: Intravenous  PONV Risk Score and Plan:   Airway Management Planned: Nasal Cannula and Simple Face Mask  Additional Equipment:   Intra-op Plan:   Post-operative Plan:   Informed Consent: I have reviewed the patients History and Physical, chart, labs and discussed the procedure including the risks, benefits and alternatives for the proposed anesthesia with the patient or authorized representative who has indicated  his/her understanding and acceptance.   Dental advisory given  Plan Discussed with: CRNA  Anesthesia Plan Comments: (D/w pt SAB with Sedation vs non triggering anesthetic- elects to proceed with SAB)        Anesthesia Quick Evaluation

## 2018-03-24 NOTE — Op Note (Signed)
03/24/2018  2:15 PM  PATIENT:  Devin Wilson  52 y.o. male  PRE-OPERATIVE DIAGNOSIS:  Medial meniscus tear left knee  POST-OPERATIVE DIAGNOSIS: Medial meniscus tear left knee, lateral meniscus tear left knee, osteoarthritis severe.  PROCEDURE:  Procedure(s): KNEE ARTHROSCOPY WITH MEDIAL MENISECTOMY (Left) with partial medial meniscectomy and chondroplasty medial femoral condyle  Arthroscopic findings  tear posterior horn medial meniscus  free edge tear medial meniscus at the body and posterior horn junction  chondral flap tear medial femoral condyle  Osteoarthritis grade 3 chondral lesion lateral tibial plateau  grade 2 lesion lateral femoral condyle  with free edge tears of the lateral meniscus throughout its course including a posterior horn tear  Grade 4 chondral lesion trochlea  Synovitis moderate  SURGEON:  Surgeon(s) and Role:    [name], MD - Primary  Knee arthroscopy dictation  The patient was identified in the preoperative holding area using 2 approved identification mechanisms. The chart was reviewed and updated. The surgical site was confirmed as left knee and marked with an indelible marker.  The patient was taken to the operating room for anesthesia. After successful spinal anesthesia, Ancef 2 g was used as IV antibiotics.  The patient was placed in the supine position with the (left lower extremity) the operative extremity in an arthroscopic leg holder and the opposite extremity in a padded leg holder.  The timeout was executed.  A lateral portal was established with an 11 blade and the scope was introduced into the joint. A diagnostic arthroscopy was performed in circumferential manner examining the entire knee joint. A medial portal was established and the diagnostic arthroscopy was repeated using a probe to palpate intra-articular structures as they were encountered.     The medial meniscus was resected using a duckbill forceps. The meniscal  fragments were removed with a motorized shaver. The meniscus was balanced with a combination of a motorized shaver and a 50 ArthroCare wand until a stable rim was obtained.  We then followed with a debridement of the chondral flap of the medial femoral condyle  This was followed by removal of the free edge tearing of the entire lateral meniscus including resection of a small portion of the posterior horn and then debridement of the tibial plateau on the lateral condyle with whisker wand.  Synovectomy was performed as needed  Specific attention was paid to the ACL.  The ACL appeared to have no evidence of a tear.  There was an empty lateral wall sign however the fibers that were remaining appeared to be complete and coming from a central position as if scarred to the PCL although there was no evidence of tearing  The tibial attachment was otherwise normal  The arthroscopic pump was placed on the wash mode and any excess debris was removed from the joint using suction.  60 cc of Marcaine with epinephrine was injected through the arthroscope.  The portals were closed with 3-0 nylon suture.  A sterile bandage, Ace wrap and Cryo/Cuff was placed and the Cryo/Cuff was activated. The patient was taken to the recovery room in stable condition.   PHYSICIAN ASSISTANT:   ASSISTANTS: none   ANESTHESIA:   spinal  EBL:  None    BLOOD ADMINISTERED:none  DRAINS: none   LOCAL MEDICATIONS USED:  MARCAINE     SPECIMEN:  No Specimen  DISPOSITION OF SPECIMEN:  N/A  COUNTS:  YES  TOURNIQUET:  * Missing tourniquet times found for documented tourniquets in log: [id number] *  DICTATION: IT trainer Dictation  PLAN OF CARE: Discharge to home after PACU  PATIENT DISPOSITION:  PACU - hemodynamically stable.   Delay start of Pharmacological VTE agent (>24hrs) due to surgical blood loss or risk of bleeding: not applicable

## 2018-03-24 NOTE — Anesthesia Procedure Notes (Signed)
Procedure Name: MAC Date/Time: 03/24/2018 12:50 PM Performed by: Vista Deck, CRNA Pre-anesthesia Checklist: Patient identified, Emergency Drugs available, Suction available, Timeout performed and Patient being monitored Patient Re-evaluated:Patient Re-evaluated prior to induction Oxygen Delivery Method: Nasal Cannula

## 2018-03-24 NOTE — H&P (Signed)
Patient ID: Devin Wilson, male   DOB: 10-25-1965, 52 y.o.   MRN: [id number]         Chief Complaint  Patient presents with  . Knee Pain      left      HPI The patient presents for evaluation of left knee pain.  The patient was climbing the steps at the hospital felt acute pain in his left knee.  He then tried to play soccer and the knee started to swell to the point where he could not bend or straighten it fully.  He cannot take NSAIDs secondary to GI intolerance and he is tried Tylenol ice rest and activity modification presents for evaluation of his left knee pain   Location left knee Duration 2-1/2 weeks Quality dull Severity moderate Associated with swelling loss of motion   Review of Systems  Respiratory: Negative for shortness of breath.   Cardiovascular: Negative for chest pain.  Skin: Negative for itching and rash.   Past Medical History:  Diagnosis Date  . Angina   . Bradycardia, sinus   . Chest pain    a. 3.15.2013 Cath: Minor Irregs  . GERD (gastroesophageal reflux disease)   . Hypertension   . Migraines   . Sleep apnea    auto set  . Thalassemia minor      Past Surgical History:  Procedure Laterality Date  . CARDIAC CATHETERIZATION  08/10/11  . HERNIA REPAIR     "in infancy; umbilical"  . LEFT HEART CATHETERIZATION WITH CORONARY ANGIOGRAM N/A 08/10/2011   Procedure: LEFT HEART CATHETERIZATION WITH CORONARY ANGIOGRAM;  Surgeon: [name], MD;  Location: [facility] CATH LAB;  Service: Cardiovascular;  Laterality: N/A;  . VASECTOMY      Family History  Problem Relation Age of Onset  . Aneurysm Father     Social History   Tobacco Use  . Smoking status: Never Smoker  . Smokeless tobacco: Never Used  Substance Use Topics  . Alcohol use: Yes    Alcohol/week: 1.0 standard drinks    Types: 1 Glasses of wine per week  . Drug use: No       Active Medications      Current Meds  Medication Sig  . amLODipine (NORVASC) 10 MG tablet    . cloNIDine  (CATAPRES) 0.1 MG tablet Take 0.2 mg by mouth at bedtime.  Marland Kitchen labetalol (NORMODYNE) 300 MG tablet Take 900 mg by mouth 2 (two) times daily.  Marland Kitchen lisinopril (PRINIVIL,ZESTRIL) 40 MG tablet Take 40 mg by mouth daily.  . potassium chloride SA (K-DUR,KLOR-CON) 20 MEQ tablet Take 80 mEq by mouth daily.  . sildenafil (VIAGRA) 50 MG tablet TK 1 T PO PRN. MDD 2 TS  . triamterene-hydrochlorothiazide (MAXZIDE-25) 37.5-25 MG per tablet Take 1 tablet by mouth daily.            Past Medical History:  Diagnosis Date  . Angina    . Bradycardia, sinus    . Chest pain      a. 3.15.2013 Cath: Minor Irregs  . GERD (gastroesophageal reflux disease)    . Hypertension    . Migraines    . Thalassemia minor               Allergies  Allergen Reactions  . Ciprofloxacin Swelling      All flouroquinolones/Facial swelling  . Levaquin [Levofloxacin In D5w]          BP 131/82   Pulse 78   Ht 5\' 9"  (1.753 m)  Wt 180 lb (81.6 kg)   BMI 26.58 kg/m      Physical Exam General appearance normal Oriented x3 normal Mood pleasant affect normal Gait limping    Ortho Exam Right knee and lower extremity Inspection and palpation revealed no abnormalities Range of motion is full No instability was detected on stress testing Muscle tone and strength was normal without tremor Skin was warm dry and intact Good pulse and temperature were noted in the extremity Sensation revealed no abnormalities to light touch   Left knee normal alignment medial joint line tenderness large effusion patient cannot extend the knee fully only flexes to 90 degrees no instability was detected muscle tone and strength were normal without tremor skin showed no redness or erythema distal pulses and temperature were normal there were no sensory abnormalities.  Provocative test for medial meniscal tear were positive with positive McMurray sign and pain and click on the medial joint line     MEDICAL DECISION MAKING    Imaging:    [facility] : plain films today show arthritis of the knee, please see dictated report          Encounter Diagnoses  Name Primary?  . Acute pain of left knee    . Derangement of posterior horn of medial meniscus of left knee Yes  . Primary osteoarthritis of left knee          PLAN:  Arthroscopy left knee, partial medial meniscectomy  The procedure has been fully reviewed with the patient; The risks and benefits of surgery have been discussed and explained and understood. Alternative treatment has also been reviewed, questions were encouraged and answered. The postoperative plan is also been reviewed.

## 2018-03-25 ENCOUNTER — Encounter (HOSPITAL_COMMUNITY): Payer: Self-pay | Admitting: Orthopedic Surgery

## 2018-03-25 LAB — POCT I-STAT 4, (NA,K, GLUC, HGB,HCT)
Glucose, Bld: 83 mg/dL (ref 70–99)
HCT: 33 % — ABNORMAL LOW (ref 39.0–52.0)
Hemoglobin: 11.2 g/dL — ABNORMAL LOW (ref 13.0–17.0)
Potassium: 3.5 mmol/L (ref 3.5–5.1)
SODIUM: 142 mmol/L (ref 135–145)

## 2018-03-31 ENCOUNTER — Ambulatory Visit (INDEPENDENT_AMBULATORY_CARE_PROVIDER_SITE_OTHER): Payer: 59 | Admitting: Orthopedic Surgery

## 2018-03-31 ENCOUNTER — Encounter: Payer: Self-pay | Admitting: Orthopedic Surgery

## 2018-03-31 VITALS — BP 120/73 | HR 69 | Ht 69.0 in | Wt 183.0 lb

## 2018-03-31 DIAGNOSIS — Z9889 Other specified postprocedural states: Secondary | ICD-10-CM

## 2018-03-31 NOTE — Progress Notes (Signed)
Chief Complaint  Patient presents with  . 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  He went back to work  Recommend home exercise program continue Cryo/Cuff and follow-up with me in 2 weeks

## 2018-04-10 DIAGNOSIS — Z9889 Other specified postprocedural states: Secondary | ICD-10-CM | POA: Insufficient documentation

## 2018-04-11 ENCOUNTER — Ambulatory Visit (INDEPENDENT_AMBULATORY_CARE_PROVIDER_SITE_OTHER): Payer: 59 | Admitting: Orthopedic Surgery

## 2018-04-11 ENCOUNTER — Telehealth: Payer: Self-pay | Admitting: Radiology

## 2018-04-11 ENCOUNTER — Encounter: Payer: Self-pay | Admitting: Orthopedic Surgery

## 2018-04-11 DIAGNOSIS — Z9889 Other specified postprocedural states: Secondary | ICD-10-CM

## 2018-04-11 NOTE — Progress Notes (Signed)
Chief Complaint  Patient presents with  . Routine Post Op    03/24/18 knee arthroscopy left     Encounter Diagnosis  Name Primary?  . S/P arthroscopy of left knee 03/24/18     Doing well  Knee feels better   No swelling from  Continue range of motion and strengthening exercises  Follow-up by phone call

## 2018-04-11 NOTE — Telephone Encounter (Signed)
Call about the Malignant hypothermia program at [facility] find out where he needs to go. [phone]

## 2018-04-15 NOTE — Telephone Encounter (Signed)
I have the number for him to call and make appointment, can you put addendum in your note so they know what he is going for, I need something to send with the referral. There is nothing in his chart.

## 2018-04-16 NOTE — Telephone Encounter (Signed)
Malignant hypothermia evaluation biopsy

## 2018-04-17 ENCOUNTER — Other Ambulatory Visit: Payer: Self-pay | Admitting: Orthopedic Surgery

## 2018-04-17 DIAGNOSIS — Z9889 Other specified postprocedural states: Secondary | ICD-10-CM

## 2018-04-17 NOTE — Telephone Encounter (Signed)
I have called to ask about the program. He will be sent a new patient evaluation form. Dr Petra Kuba will review and will advise if patient needs testing Earl Gala from [facility] will reach out to patient for all information. I have advised her to get any information regarding the possible diagnosis of Malignant hypothermia from the patient, since I have nothing in his chart and he is a physician. She will reach out to him and let me know if I need to do anything for this.

## 2018-07-10 DIAGNOSIS — N182 Chronic kidney disease, stage 2 (mild): Secondary | ICD-10-CM | POA: Diagnosis not present

## 2018-07-10 DIAGNOSIS — I10 Essential (primary) hypertension: Secondary | ICD-10-CM | POA: Diagnosis not present

## 2018-07-10 DIAGNOSIS — E876 Hypokalemia: Secondary | ICD-10-CM | POA: Diagnosis not present

## 2018-08-01 DIAGNOSIS — Z79899 Other long term (current) drug therapy: Secondary | ICD-10-CM | POA: Diagnosis not present

## 2018-08-15 LAB — HM HIV SCREENING LAB: HM HIV Screening: NEGATIVE

## 2018-11-06 ENCOUNTER — Telehealth: Payer: Self-pay

## 2018-11-06 ENCOUNTER — Other Ambulatory Visit: Payer: 59

## 2018-11-06 DIAGNOSIS — Z20822 Contact with and (suspected) exposure to covid-19: Secondary | ICD-10-CM

## 2018-11-06 NOTE — Telephone Encounter (Signed)
Patient called and says he has an order from his doctor to get tested for covid due to a fever and he is calling to get an appointment. Appointment scheduled for today at 1245 at [facility]. Advised to wear a mask for everyone in the vehicle and location. Order placed.  Referred by Dr. Redmond School Office: [phone] Fax: [phone]

## 2018-11-09 LAB — NOVEL CORONAVIRUS, NAA: SARS-CoV-2, NAA: NOT DETECTED

## 2018-11-21 ENCOUNTER — Other Ambulatory Visit (HOSPITAL_COMMUNITY): Payer: Self-pay | Admitting: Family Medicine

## 2018-11-21 LAB — GRAM STAIN

## 2018-11-26 ENCOUNTER — Telehealth: Payer: Self-pay

## 2018-11-26 DIAGNOSIS — B009 Herpesviral infection, unspecified: Secondary | ICD-10-CM

## 2018-11-26 LAB — GONOCOCCUS CULTURE

## 2018-11-26 NOTE — Telephone Encounter (Signed)
TC from patient. Verified ID via password. Informed of +GC and need for tx. Patient received 1gm of Azithromycin at visit however this is not sufficient treatment.  Scheduled for tx appt tomorrow. Instructed to eat before visit. Aileen Fass, RN

## 2018-11-27 ENCOUNTER — Ambulatory Visit: Payer: Self-pay

## 2018-11-27 ENCOUNTER — Other Ambulatory Visit: Payer: Self-pay

## 2018-11-27 DIAGNOSIS — A549 Gonococcal infection, unspecified: Secondary | ICD-10-CM | POA: Insufficient documentation

## 2018-11-27 MED ORDER — AZITHROMYCIN 500 MG PO TABS
1000.0000 mg | ORAL_TABLET | Freq: Once | ORAL | Status: AC
  Administered 2018-11-27: 1000 mg via ORAL

## 2018-11-27 MED ORDER — CEFTRIAXONE SODIUM 250 MG IJ SOLR
250.0000 mg | Freq: Once | INTRAMUSCULAR | Status: AC
  Administered 2018-11-27: 250 mg via INTRAMUSCULAR

## 2018-11-27 NOTE — Progress Notes (Signed)
Pt to clinic for tx of GC. Discussed tx with Dr. Lauretta Chester due to pt's Ciprofloxacin allergy and pt states his kidney function is a "little off" and is being seen by a physician for it. Per Dr. Lauretta Chester, pt treated for [facility] per standing order with the Ceftriaxone and Zithromax and pt assured that these medications will not interfere with his kidney function.

## 2018-12-05 ENCOUNTER — Other Ambulatory Visit: Payer: Self-pay

## 2018-12-05 ENCOUNTER — Ambulatory Visit: Payer: Self-pay

## 2018-12-05 DIAGNOSIS — Z202 Contact with and (suspected) exposure to infections with a predominantly sexual mode of transmission: Secondary | ICD-10-CM
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
## 2018-12-08 ENCOUNTER — Encounter: Payer: Self-pay | Admitting: Nurse Practitioner

## 2018-12-08 DIAGNOSIS — Z202 Contact with and (suspected) exposure to infections with a predominantly sexual mode of transmission: Secondary | ICD-10-CM | POA: Insufficient documentation

## 2018-12-08 MED ORDER — AZITHROMYCIN 500 MG PO TABS
1000.0000 mg | ORAL_TABLET | Freq: Once | ORAL | Status: AC
  Administered 2018-12-08: 1000 mg via ORAL

## 2018-12-08 MED ORDER — CEFTRIAXONE SODIUM 250 MG IJ SOLR
250.0000 mg | Freq: Once | INTRAMUSCULAR | Status: AC
  Administered 2018-12-08: 250 mg via INTRAMUSCULAR

## 2018-12-09 ENCOUNTER — Encounter: Payer: Self-pay | Admitting: Nurse Practitioner

## 2018-12-23 ENCOUNTER — Encounter: Payer: Self-pay | Admitting: Family Medicine

## 2019-02-18 ENCOUNTER — Other Ambulatory Visit: Payer: Self-pay

## 2019-02-18 DIAGNOSIS — Z20822 Contact with and (suspected) exposure to covid-19: Secondary | ICD-10-CM

## 2019-02-19 LAB — NOVEL CORONAVIRUS, NAA: SARS-CoV-2, NAA: NOT DETECTED

## 2019-03-20 ENCOUNTER — Other Ambulatory Visit: Payer: Self-pay

## 2019-03-20 DIAGNOSIS — Z20822 Contact with and (suspected) exposure to covid-19: Secondary | ICD-10-CM

## 2019-03-22 LAB — NOVEL CORONAVIRUS, NAA: SARS-CoV-2, NAA: NOT DETECTED

## 2019-04-09 ENCOUNTER — Telehealth: Payer: Self-pay | Admitting: Physician Assistant

## 2019-04-09 NOTE — Telephone Encounter (Signed)
[facility] sent refill request for patient for Acyclovir.  Form filled out and attempted to fax to number on form but fax failed x 3.  TC to pharmacist at [phone].  Verbally gave pharmacist order for Acyclovir 800mg  #90 1 po daily with refills x 3 (for 1 year).

## 2019-04-30 ENCOUNTER — Other Ambulatory Visit: Payer: Self-pay

## 2019-04-30 DIAGNOSIS — Z20822 Contact with and (suspected) exposure to covid-19: Secondary | ICD-10-CM

## 2019-05-02 LAB — NOVEL CORONAVIRUS, NAA: SARS-CoV-2, NAA: NOT DETECTED

## 2019-05-12 ENCOUNTER — Encounter (INDEPENDENT_AMBULATORY_CARE_PROVIDER_SITE_OTHER): Payer: Self-pay | Admitting: *Deleted

## 2019-07-01 ENCOUNTER — Encounter (INDEPENDENT_AMBULATORY_CARE_PROVIDER_SITE_OTHER): Payer: Self-pay | Admitting: *Deleted

## 2019-07-14 IMAGING — CT CT ABD-PELV W/O CM
2 of 4 series · 16 of 46 positions shown, 18 images · non-contrast
Comparison: Chest CT 09/17/2011

CLINICAL DATA: Hyperaldosteronism. Rule out adrenal adenoma or
adrenal hyperplasia.

EXAM:
CT ABDOMEN AND PELVIS WITHOUT CONTRAST
TECHNIQUE: Multidetector CT imaging of the abdomen and pelvis was performed
following the standard protocol without IV contrast.

[Series 2: axial st · axial · 0.66mm/px · z∈[+992,+1352]mm · 13 of 82 slices shown, 15 images]
[im 7/82  soft-tissue]
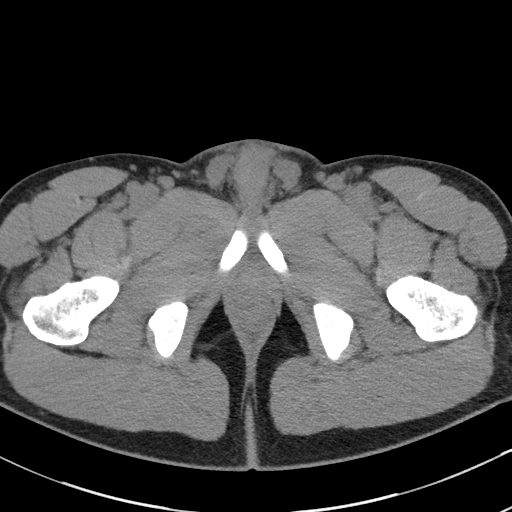
[im 7/82  bone]
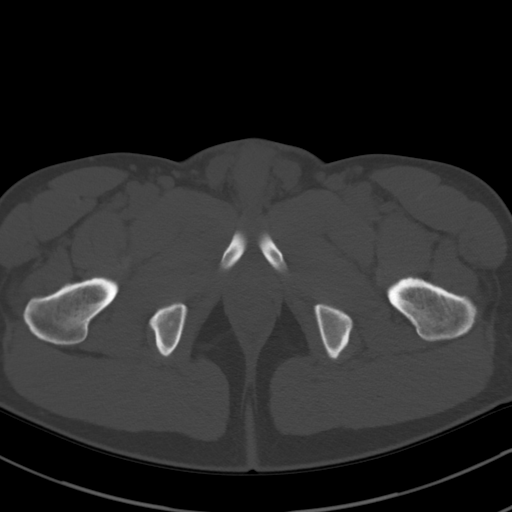
[im 13/82  soft-tissue]
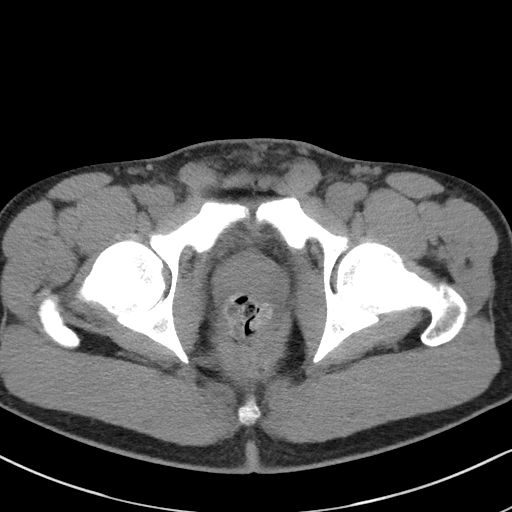
[im 19/82  soft-tissue]
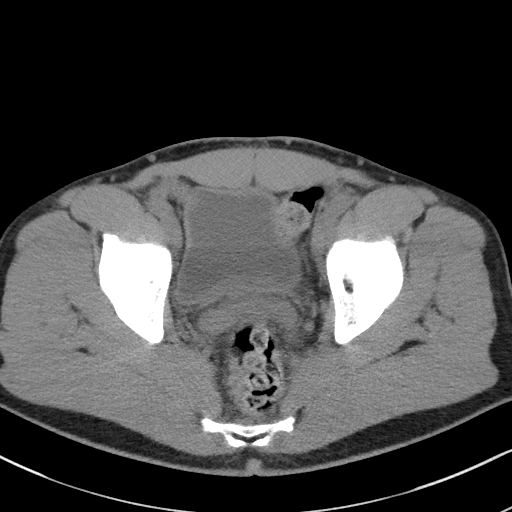
[im 25/82  soft-tissue]
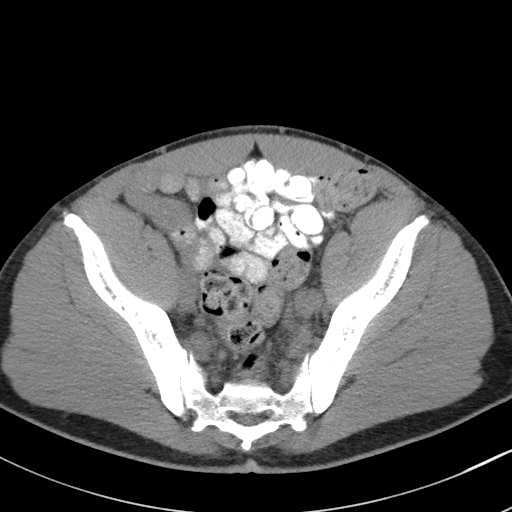
[im 31/82  soft-tissue]
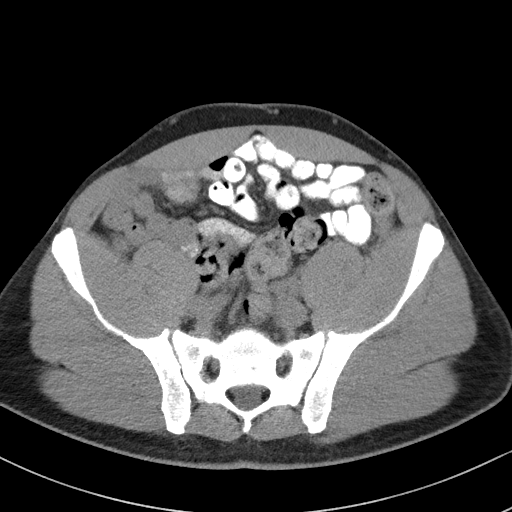
[im 37/82  soft-tissue]
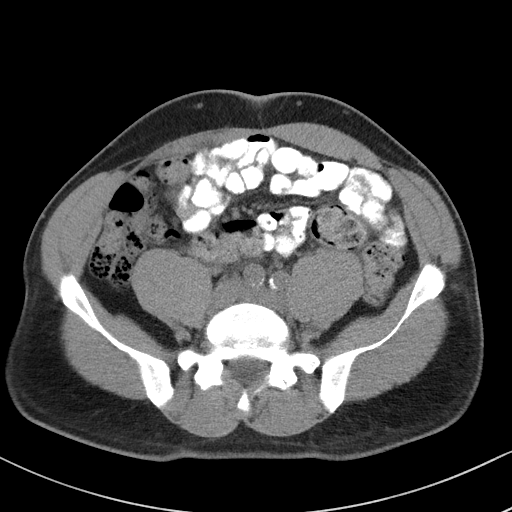
[im 43/82  soft-tissue]
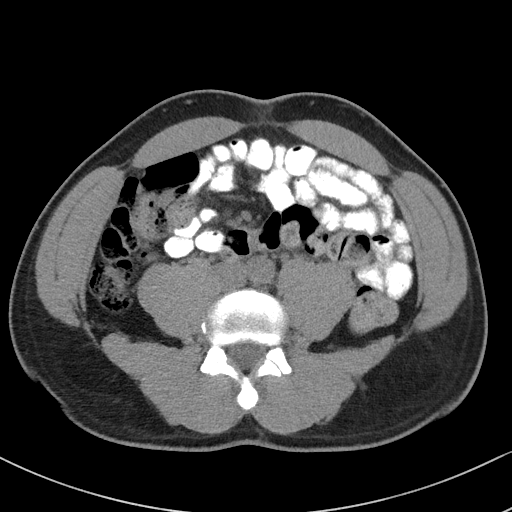
[im 49/82  soft-tissue]
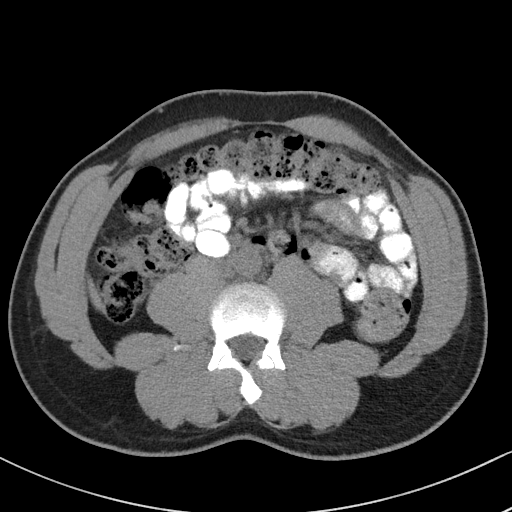
[im 55/82  soft-tissue]
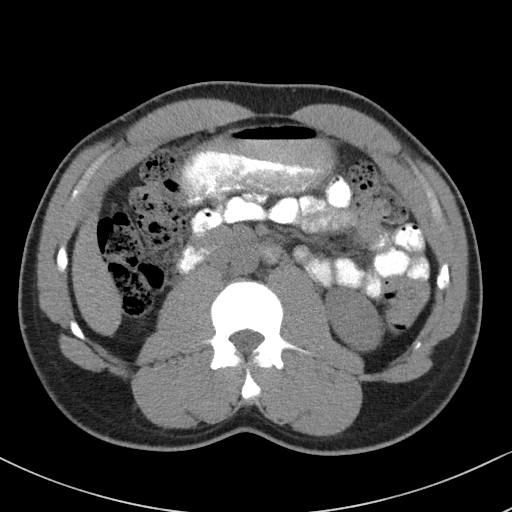
[im 55/82  bone]
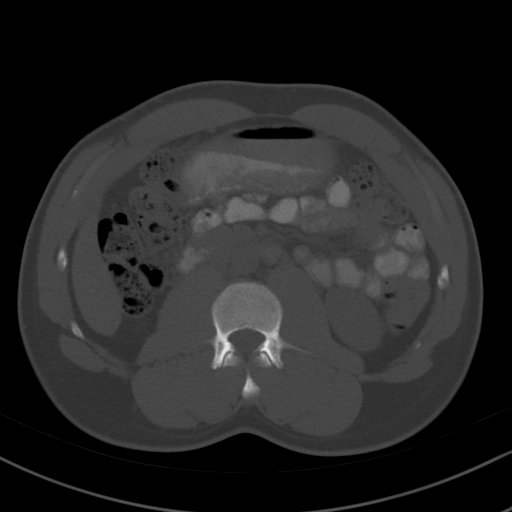
[im 61/82  soft-tissue]
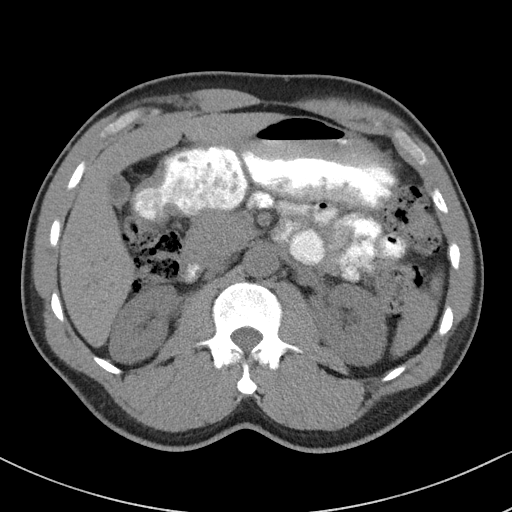
[im 67/82  soft-tissue]
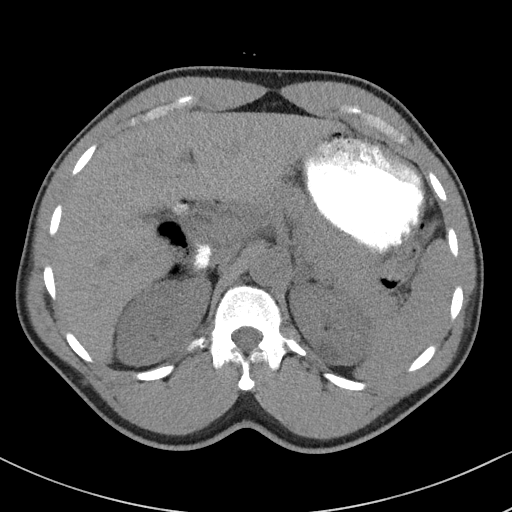
[im 73/82  soft-tissue]
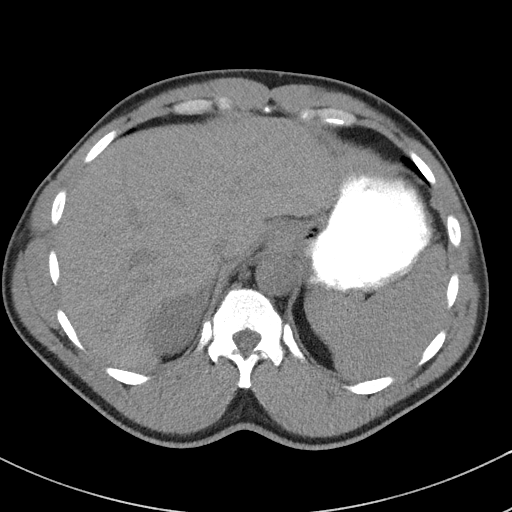
[im 79/82  soft-tissue]
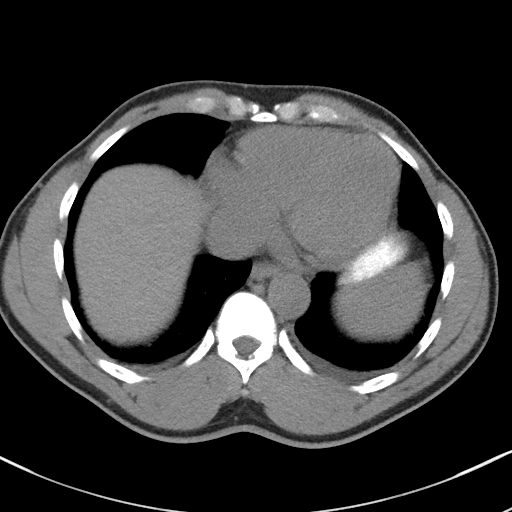

[Series 5: coronal st · coronal · 0.72mm/px · 3 of 83 slices shown]
[im 28/83  soft-tissue]
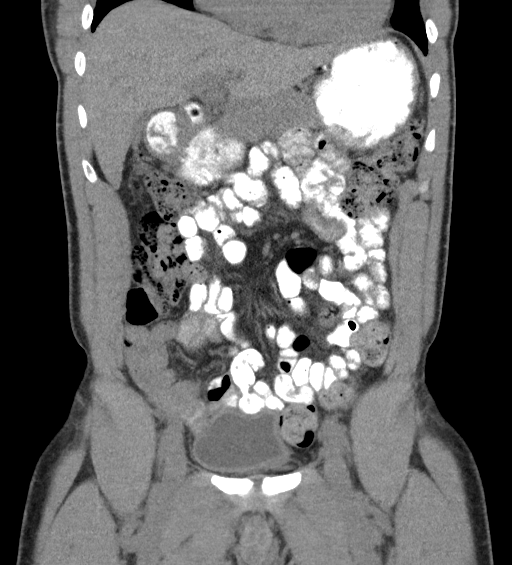
[im 37/83  soft-tissue]
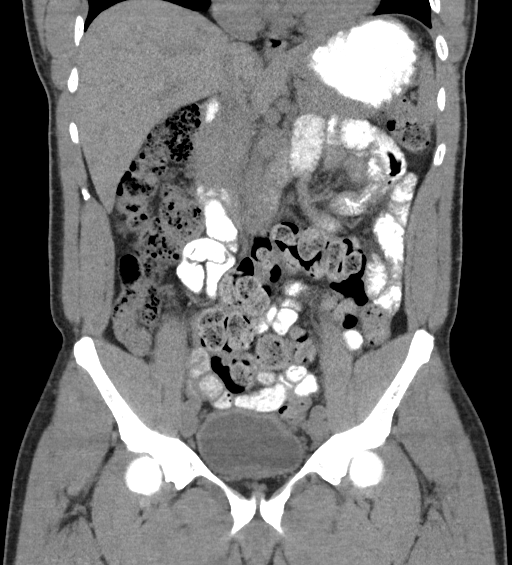
[im 46/83  soft-tissue]
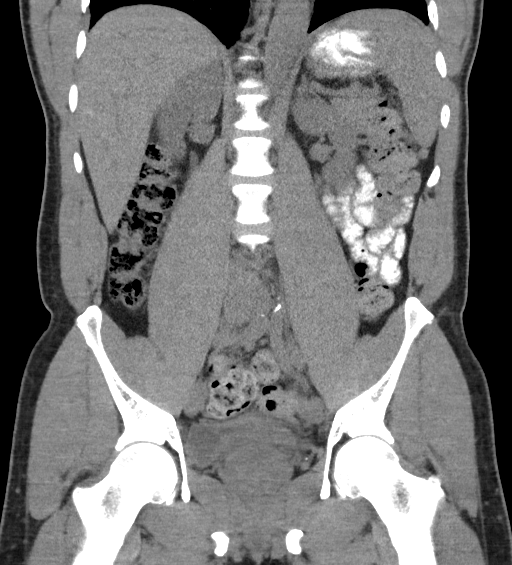

[16 of 46 positions shown; findings below may reference images not displayed]

FINDINGS: Lower chest: Cardiomegaly with small pleural effusions. No active
pulmonary disease.

Hepatobiliary: No focal liver abnormality is seen. No gallstones,
gallbladder wall thickening, or biliary dilatation.

Pancreas: Unremarkable. No pancreatic ductal dilatation or
surrounding inflammatory changes.

Spleen: Normal in size without focal abnormality.

Adrenals/Urinary Tract: The adrenal glands are normal in appearance
without mass or hyperplasia. There is a water attenuating 19 mm cyst
in the upper pole the left kidney. No nephrolithiasis nor
obstructive uropathy. The urinary bladder is unremarkable.

Stomach/Bowel: Stomach is within normal limits. Appendix appears
normal. No evidence of bowel wall thickening, distention, or
inflammatory changes. Increased stool retention throughout the
colon.

Vascular/Lymphatic: No significant vascular findings are present. No
enlarged abdominal or pelvic lymph nodes.

Reproductive: Prostate is unremarkable. Seminal vesicles are
unremarkable. Right-sided surgical clip at base of scrotum likely
from prior vasectomy.

Other: No abdominal wall hernia or abnormality. No abdominopelvic
ascites.

Musculoskeletal: No acute or significant osseous findings.
IMPRESSION: 1. 19 mm simple appearing cyst in the upper pole the left kidney.
2. No adrenal mass nor hyperplasia is identified.
3. Cardiomegaly with small bilateral pleural effusions.

## 2019-07-20 IMAGING — DX DG CHEST 2V
2 series · 2 of 2 positions shown · non-contrast
Comparison: CT chest and single view of the chest 08/10/2011.

CLINICAL DATA: Preoperative examination. Patient for cardiac
catheterization.

EXAM:
CHEST - 2 VIEW

[chest pa]
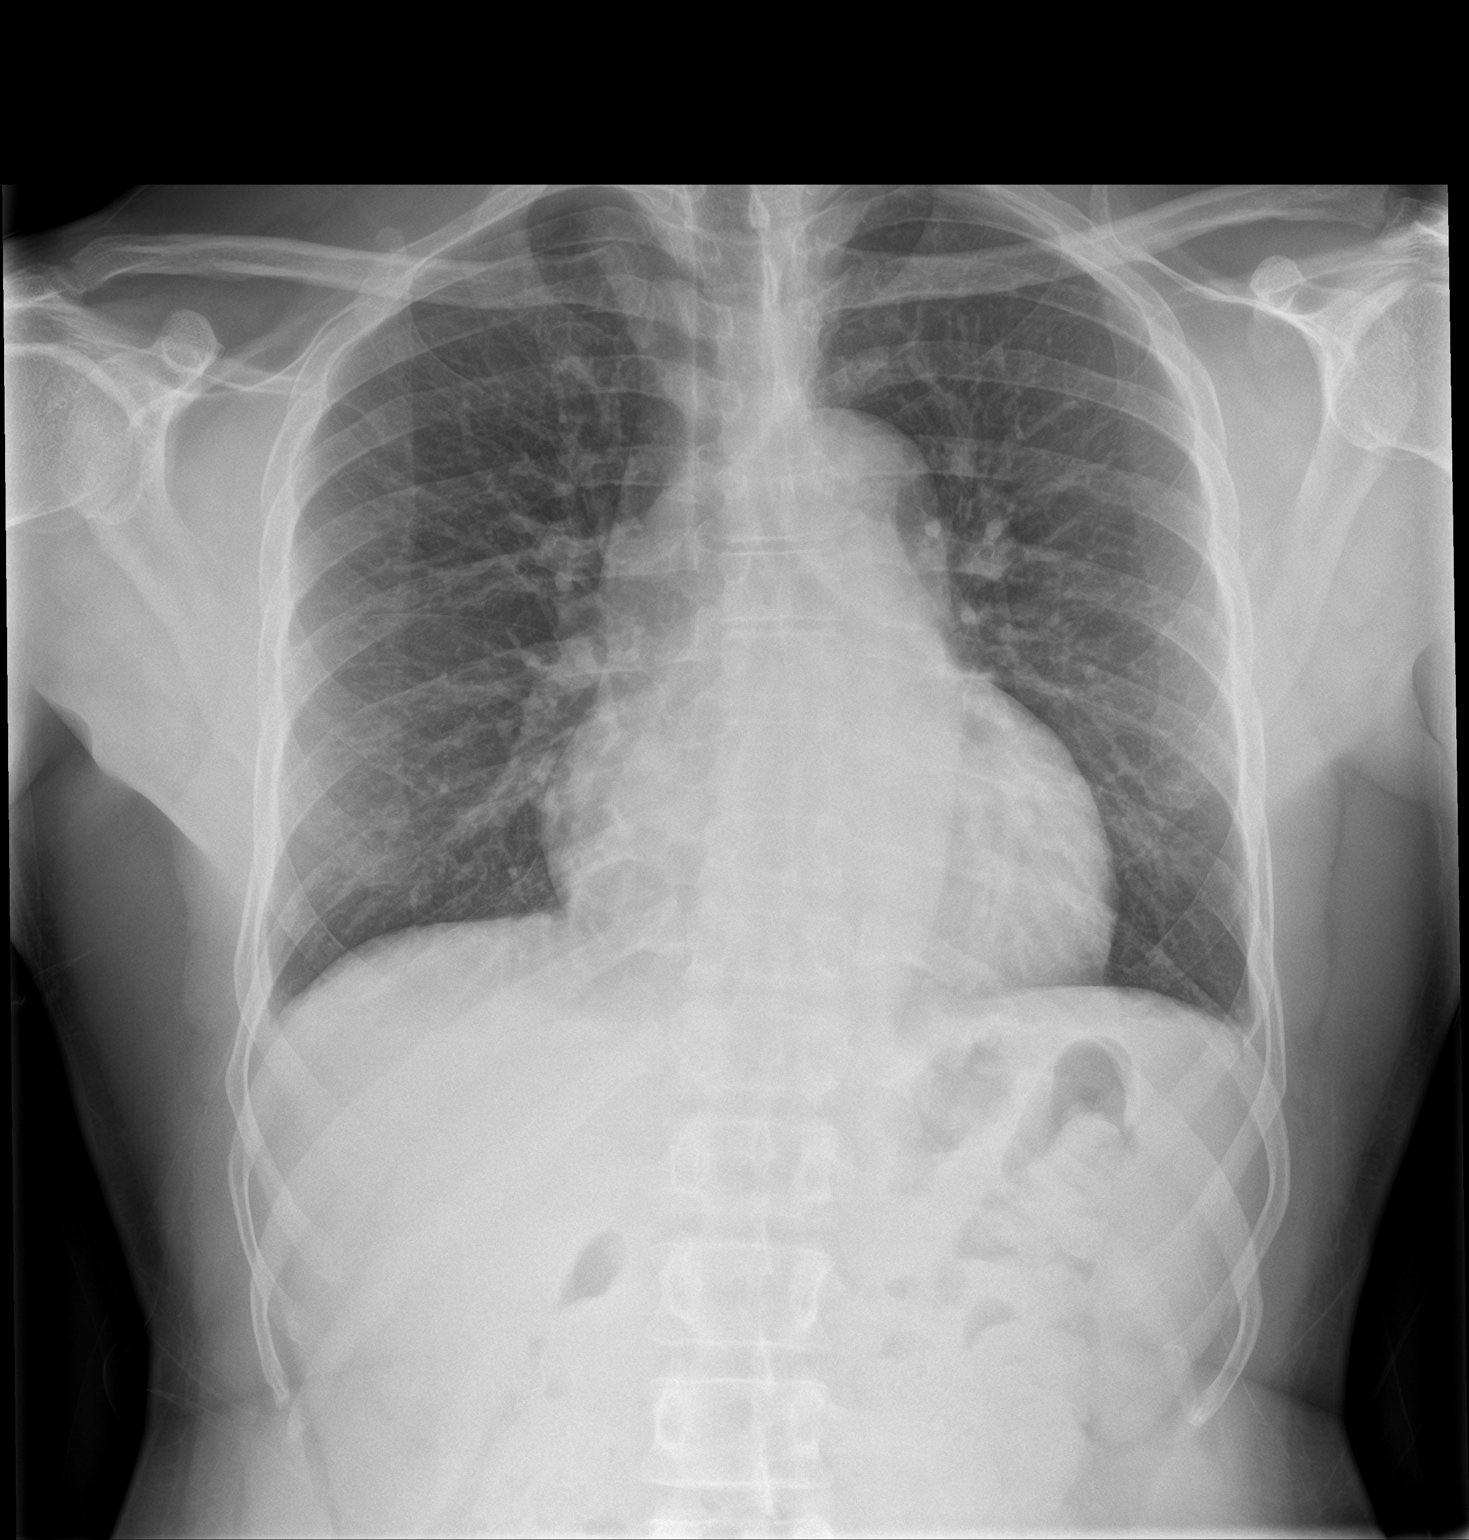

[chest lat]
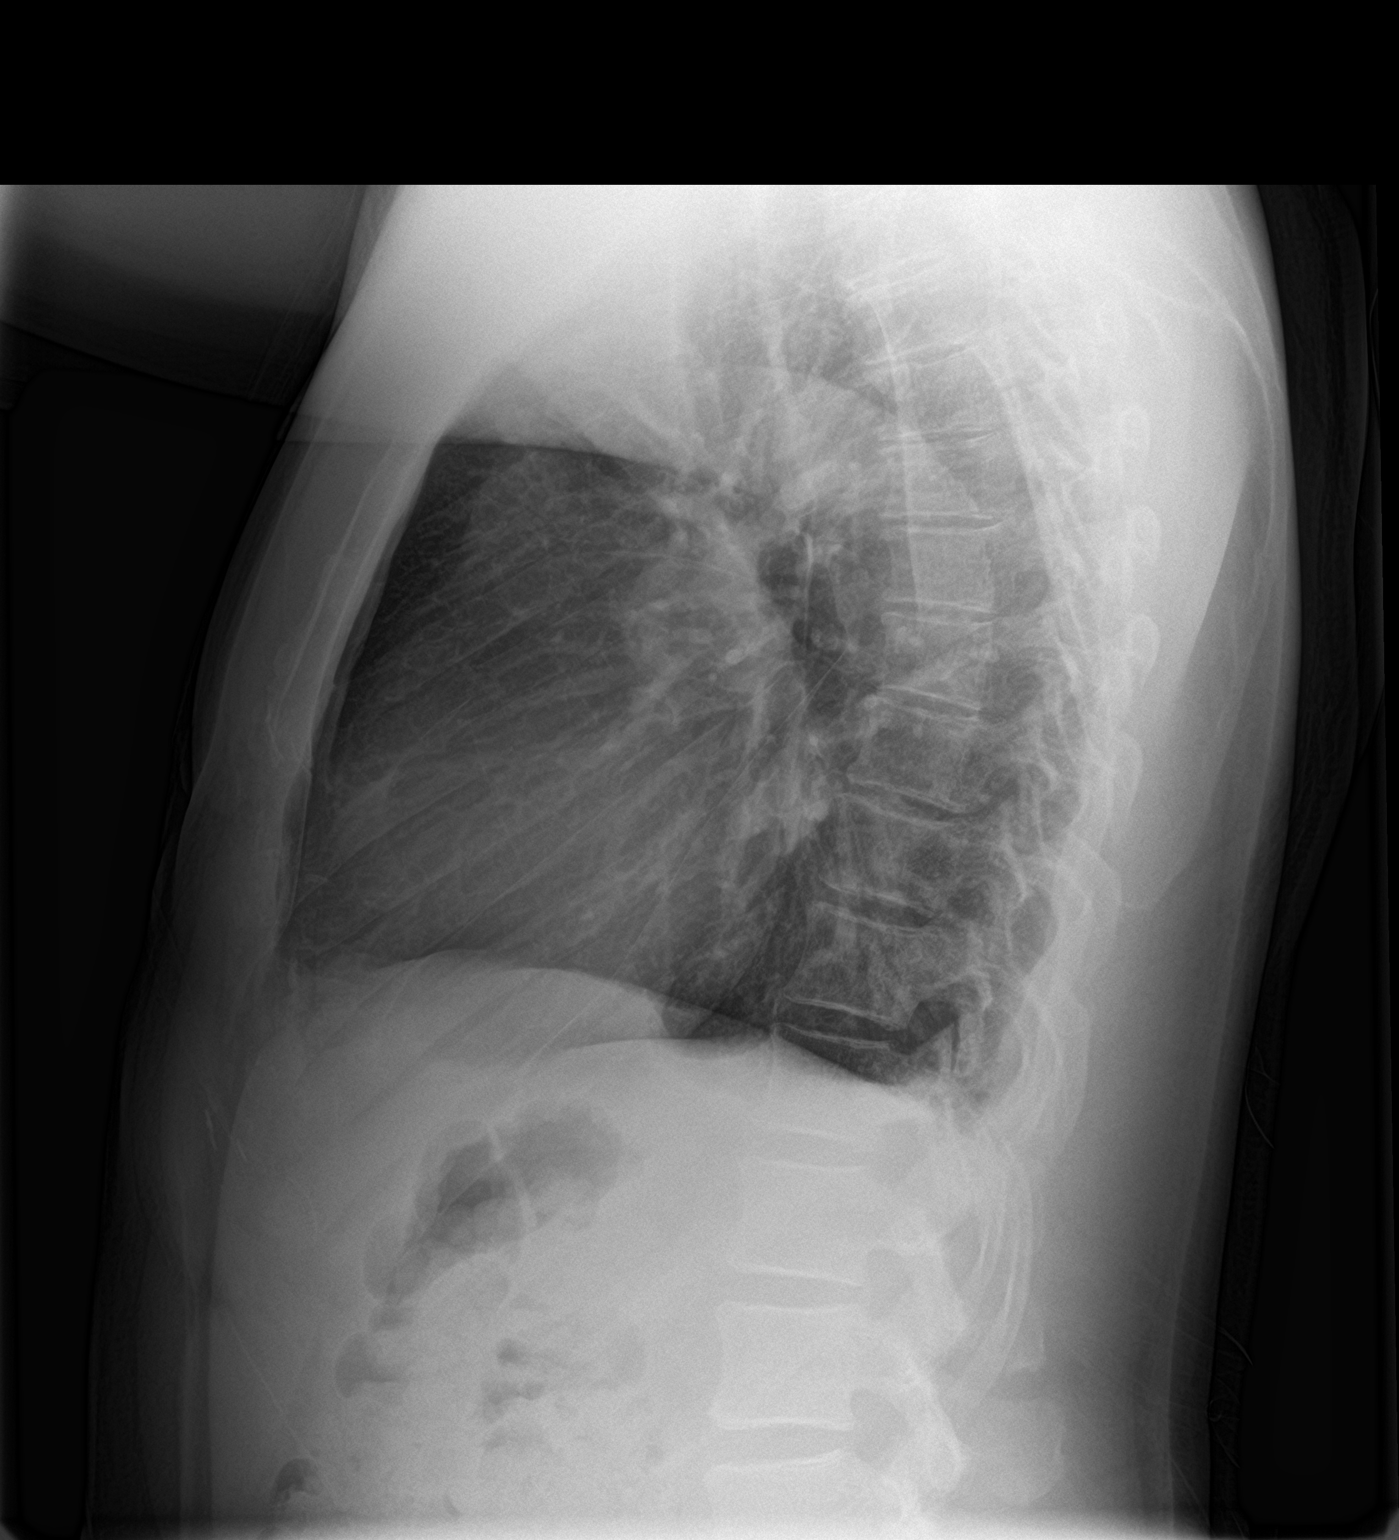

[2 of 2 positions shown; findings below may reference images not displayed]

FINDINGS: There is cardiomegaly without edema. No consolidative process,
pneumothorax or effusion. No acute or focal bony abnormality.
IMPRESSION: Cardiomegaly without acute disease.

## 2019-10-22 ENCOUNTER — Ambulatory Visit: Payer: Self-pay | Admitting: Physician Assistant

## 2019-10-22 ENCOUNTER — Other Ambulatory Visit: Payer: Self-pay

## 2019-10-22 DIAGNOSIS — Z113 Encounter for screening for infections with a predominantly sexual mode of transmission: Secondary | ICD-10-CM

## 2019-10-23 ENCOUNTER — Encounter: Payer: Self-pay | Admitting: Physician Assistant

## 2019-10-23 LAB — GRAM STAIN

## 2019-10-23 NOTE — Progress Notes (Signed)
[facility] Department STI clinic/screening visit  Subjective:  Devin Wilson is a 54 y.o. male being seen today for an STI screening visit. The patient reports they do not have symptoms.    Patient has the following medical conditions:   Patient Active Problem List   Diagnosis Date Noted  . Exposure to sexually transmitted disease (STD) 12/08/2018  . Gonorrhea 11/27/2018  . S/P arthroscopy of left knee 03/24/18 04/10/2018  . Old complex tear of medial meniscus of left knee   . 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 Primary osteoarthritis of left knee   . Stage 2 chronic kidney disease 01/13/2018  . HSV-2 (herpes simplex virus 2) infection 12/20/2017  . Medial meniscus, posterior horn derangement 02/20/2012  . GERD (gastroesophageal reflux disease)   . Chest pain on exertion 08/10/2011  . Primary hypertension 08/10/2011  . Unstable angina ([place]) 08/10/2011  . Thalassemia minor   . SPRAIN AND STRAIN OF METACARPOPHALANGEAL OF HAND 08/16/2009     Chief Complaint  Patient presents with  . SEXUALLY TRANSMITTED DISEASE    screening    HPI  Patient reports that he is not having symptoms but would like a screening today.  Last HIV test was about 1 year ago.  Denies changes to medical history.  Takes 4 meds for BP.   See flowsheet for further details and programmatic requirements.    The following portions of the patient's history were reviewed and updated as appropriate: allergies, current medications, past medical history, past social history, past surgical history and problem list.  Objective:  There were no vitals filed for this visit.  Physical Exam Constitutional:      General: He is not in acute distress.    Appearance: Normal appearance.  HENT:     Head: Normocephalic and atraumatic.     Comments: No nits, lice, or hair loss. No cervical, supraclavicular or axillary adenopathy.    Mouth/Throat:     Mouth: Mucous membranes are  moist.     Pharynx: Oropharynx is clear. No oropharyngeal exudate or posterior oropharyngeal erythema.  Eyes:     Conjunctiva/sclera: Conjunctivae normal.  Pulmonary:     Effort: Pulmonary effort is normal.  Abdominal:     Palpations: Abdomen is soft. There is no mass.     Tenderness: There is no abdominal tenderness. There is no guarding or rebound.  Genitourinary:    Penis: Normal.      Testes: Normal.     Comments: Pubic area without nits, lice, edema, erythema, lesions and inguinal adenopathy. Penis uncircumcised, without rash, lesions and discharge at meatus. Musculoskeletal:     Cervical back: Neck supple. No tenderness.  Skin:    General: Skin is warm and dry.     Findings: No bruising, erythema, lesion or rash.  Neurological:     Mental Status: He is alert and oriented to person, place, and time.  Psychiatric:        Mood and Affect: Mood normal.        Behavior: Behavior normal.        Thought Content: Thought content normal.        Judgment: Judgment normal.       Assessment and Plan:  Devin Wilson is a 54 y.o. male presenting to the [place] [facility] for STI screening  1. Screening for STD (sexually transmitted disease) Patient into clinic without symptoms. Rec condoms with all sex. Await test results.  Counseled that RN  will call if needs to RTC for treatment once results are back. Enc patient to continue regular follow up with PCP. - Gram stain - Gonococcus culture - HIV [facility] - Syphilis Serology, [place] [facility] - Gonococcus culture     No follow-ups on file.  No future appointments.  Jerene Dilling, PA

## 2019-10-26 LAB — GONOCOCCUS CULTURE

## 2020-01-30 ENCOUNTER — Other Ambulatory Visit: Payer: Self-pay | Admitting: Physician Assistant

## 2020-01-31 ENCOUNTER — Other Ambulatory Visit: Payer: Self-pay | Admitting: Physician Assistant

## 2020-02-02 NOTE — Telephone Encounter (Signed)
Per chart note patient had refills for 1 year sent 03/2019, but to a different pharmacy.  Will OK refills to this pharmacy for 1 year.

## 2020-02-04 ENCOUNTER — Other Ambulatory Visit: Payer: Self-pay

## 2020-02-04 ENCOUNTER — Ambulatory Visit: Payer: Self-pay | Admitting: Physician Assistant

## 2020-02-04 DIAGNOSIS — Z113 Encounter for screening for infections with a predominantly sexual mode of transmission: Secondary | ICD-10-CM

## 2020-02-04 DIAGNOSIS — N341 Nonspecific urethritis: Secondary | ICD-10-CM

## 2020-02-04 LAB — GRAM STAIN

## 2020-02-04 MED ORDER — DOXYCYCLINE HYCLATE 100 MG PO TABS: 100.0000 mg | ORAL_TABLET | Freq: Two times a day (BID) | ORAL | 0 refills | Status: DC

## 2020-02-04 NOTE — Progress Notes (Signed)
Patient reports burning with urination. Deri Fuelling, RN   Post: Gram stain results reviewed with patients. NUG positive. Patient treated with Doxycyline #14. Provider orders complete.   Deri Fuelling, RN

## 2020-02-04 NOTE — Progress Notes (Signed)
[facility] Department STI clinic/screening visit  Subjective:  Devin Wilson is a 54 y.o. male being seen today for an STI screening visit. The patient reports they do have symptoms.    Patient has the following medical conditions:   Patient Active Problem List   Diagnosis Date Noted  . Exposure to sexually transmitted disease (STD) 12/08/2018  . Gonorrhea 11/27/2018  . S/P arthroscopy of left knee 03/24/18 04/10/2018  . Old complex tear of medial meniscus of left knee   . 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 Primary osteoarthritis of left knee   . Stage 2 chronic kidney disease 01/13/2018  . HSV-2 (herpes simplex virus 2) infection 12/20/2017  . Medial meniscus, posterior horn derangement 02/20/2012  . GERD (gastroesophageal reflux disease)   . Chest pain on exertion 08/10/2011  . Primary hypertension 08/10/2011  . Unstable angina ([place]) 08/10/2011  . Thalassemia minor   . SPRAIN AND STRAIN OF METACARPOPHALANGEAL OF HAND 08/16/2009     Chief Complaint  Patient presents with  . SEXUALLY TRANSMITTED DISEASE    screening    HPI  Patient reports burning with urination for at least the last 2 weeks. Denies discharge or hematuria.  Patient's last HIV test was in May 2021.   See flowsheet for further details and programmatic requirements.    The following portions of the patient's history were reviewed and updated as appropriate: allergies, current medications, past medical history, past social history, past surgical history and problem list.  Objective:  There were no vitals filed for this visit.  Physical Exam Constitutional:      General: He is not in acute distress.    Appearance: Normal appearance.  HENT:     Head: Normocephalic and atraumatic.     Comments: No nits, lice or hair loss. No cervical, supraclavicular or axillary adenopathy.    Mouth/Throat:     Mouth: Mucous membranes are moist.     Pharynx: Oropharynx is clear.  No oropharyngeal exudate or posterior oropharyngeal erythema.  Eyes:     Conjunctiva/sclera: Conjunctivae normal.  Pulmonary:     Effort: Pulmonary effort is normal.  Abdominal:     Palpations: Abdomen is soft. There is no mass.     Tenderness: There is no abdominal tenderness. There is no guarding or rebound.  Genitourinary:    Penis: Normal.      Testes: Normal.     Comments: Pubic area without nits, lice, edema, erythema, lesions and inguinal adenopathy. Penis uncircumcised, without rash, lesions and discharge at meatus. Musculoskeletal:     Cervical back: Neck supple. No tenderness.  Skin:    General: Skin is warm and dry.     Findings: No bruising, erythema, lesion or rash.  Neurological:     Mental Status: He is alert and oriented to person, place, and time.  Psychiatric:        Mood and Affect: Mood normal.        Behavior: Behavior normal.        Thought Content: Thought content normal.        Judgment: Judgment normal.       Assessment and Plan:  Devin Wilson is a 54 y.o. male presenting to the [facility] for STI screening  1. Screening for STD (sexually transmitted disease) Patient into clinic with symptoms. Rec condoms with all sex. Await test results.  Counseled that RN will call if needs to RTC for treatment once results are  back. - Gram stain - Gonococcus culture - HBV Antigen/Antibody [facility] - HIV/HCV [place] [facility] - Syphilis Serology, [facility] - Gonococcus culture   2. NGU (nongonococcal urethritis) Will treat NGU with Doxycycline 100 mg #14 1 po BID for 7 days. No sex for 7 days and until after partner/s have completed treatment. Call with questions or concerns. - doxycycline (VIBRA-TABS) 100 MG tablet; Take 1 tablet (100 mg total) by mouth 2 (two) times daily.  Dispense: 14 tablet; Refill: 0   I saw this patient with FNP student Harlow Mares.  The student did the patient interview and entered that  information into the flowsheet after reviewing it with me.  I performed the exam with the student in the room with patient's permission.  We discussed the patient's Gram stain findings and treatment plan.  No follow-ups on file.  No future appointments.  Jerene Dilling, PA

## 2020-02-06 ENCOUNTER — Encounter: Payer: Self-pay | Admitting: Physician Assistant

## 2020-02-09 ENCOUNTER — Encounter: Payer: Self-pay | Admitting: Family Medicine

## 2020-02-09 LAB — HEPATITIS B SURFACE ANTIGEN

## 2020-02-09 LAB — GONOCOCCUS CULTURE

## 2020-02-11 ENCOUNTER — Encounter: Payer: Self-pay | Admitting: Family Medicine

## 2020-02-11 LAB — HM HIV SCREENING LAB: HM HIV Screening: NEGATIVE

## 2020-02-11 LAB — HM HEPATITIS C SCREENING LAB: HM Hepatitis Screen: NEGATIVE

## 2020-02-17 ENCOUNTER — Telehealth: Payer: Self-pay | Admitting: *Deleted

## 2020-02-17 NOTE — Telephone Encounter (Signed)
-----   Message from Claudina Lick, LPN sent at 2/70/6237  2:56 PM EDT ----- Regarding: FW: Colonoscopy Angie,please triage pt.  ----- Message ----- From: Daneil Dolin, MD Sent: 02/11/2020   2:06 PM EDT To: Phillips Odor, MD, Claudina Lick, LPN Subject: Colonoscopy                                    Dear Trey Sailors,  Reviewed colonoscopy records.  Your colonoscopy was done previously on 11/03/04.  Found only minimal hemorrhoids. You would be due now for average risk screening.  One of my staff will reach out to you to get a little more information to see when you might be ready to proceed at your convenience.Thanks.  Ronalee Belts.

## 2020-02-17 NOTE — Telephone Encounter (Signed)
Pt is scheduled for telephone nurse triage on 02/18/2020.

## 2020-02-18 ENCOUNTER — Ambulatory Visit (INDEPENDENT_AMBULATORY_CARE_PROVIDER_SITE_OTHER): Payer: Self-pay | Admitting: *Deleted

## 2020-02-18 ENCOUNTER — Other Ambulatory Visit: Payer: Self-pay

## 2020-02-18 DIAGNOSIS — Z1211 Encounter for screening for malignant neoplasm of colon: Secondary | ICD-10-CM

## 2020-02-18 MED ORDER — NA SULFATE-K SULFATE-MG SULF 17.5-3.13-1.6 GM/177ML PO SOLN: 1.0000 | Freq: Once | ORAL | 0 refills | Status: AC

## 2020-02-18 NOTE — Patient Instructions (Signed)
Devin Wilson  1966-01-01 MRN: [id number]     Procedure Date: 04/06/2020 Time to register: 1:00 pm Place to register: [facility] Procedure Time: 2:00 pm Scheduled provider: Dr. Gala Romney    PREPARATION FOR COLONOSCOPY WITH SUPREP BOWEL PREP KIT  Note: Suprep Bowel Prep Kit is a split-dose (2day) regimen. Consumption of BOTH 6-ounce bottles is required for a complete prep.  Please notify us immediately if you are diabetic, take iron supplements, or if you are on Coumadin or any other blood thinners.  Please hold the following medications: n/a                                                                                                                                                  2 DAYS BEFORE PROCEDURE:  DATE: 04/04/2020   DAY: Monday Begin clear liquid diet AFTER your lunch meal. NO SOLID FOODS after this point.  1 DAY BEFORE PROCEDURE:  DATE: 04/05/2020   DAY: Tuesday Continue clear liquids the entire day - NO SOLID FOOD.   Diabetic medications adjustments for today: n/a  At 6:00pm: Complete steps 1 through 4 below, using ONE (1) 6-ounce bottle, before going to bed. Step 1:  Pour ONE (1) 6-ounce bottle of SUPREP liquid into the mixing container.  Step 2:  Add cool drinking water to the 16 ounce line on the container and mix.  Note: Dilute the solution concentrate as directed prior to use. Step 3:  DRINK ALL the liquid in the container. Step 4:  You MUST drink an additional two (2) or more 16 ounce containers of water over the next one (1) hour.   Continue clear liquids.  DAY OF PROCEDURE:   DATE: 04/06/2020   DAY: Wednesday If you take medications for your heart, blood pressure, or breathing, you may take these medications.  Diabetic medications adjustments for today: n/a  5 hours before your procedure at :  9:00 am Step 1:  Pour ONE (1) 6-ounce bottle of SUPREP liquid into the mixing container.  Step 2:  Add cool drinking water to the 16 ounce line on  the container and mix.  Note: Dilute the solution concentrate as directed prior to use. Step 3:  DRINK ALL the liquid in the container. Step 4:  You MUST drink an additional two (2) or more 16 ounce containers of water over the next one (1) hour. You MUST complete the final glass of water at least 3 hours before your colonoscopy. Nothing by mouth past 11:00 am.  You may take your morning medications with sip of water unless we have instructed otherwise.    Please see below for Dietary Information.  CLEAR LIQUIDS INCLUDE:  Water Jello (NOT red in color)   Ice Popsicles (NOT red in color)   Tea (sugar ok, no milk/cream) Powdered fruit flavored drinks  Coffee (sugar  ok, no milk/cream) Gatorade/ Lemonade/ Kool-Aid  (NOT red in color)   Juice: apple, white grape, white cranberry Soft drinks  Clear bullion, consomme, broth (fat free beef/chicken/vegetable)  Carbonated beverages (any kind)  Strained chicken noodle soup Hard Candy   Remember: Clear liquids are liquids that will allow you to see your fingers on the other side of a clear glass. Be sure liquids are NOT red in color, and not cloudy, but CLEAR.  DO NOT EAT OR DRINK ANY OF THE FOLLOWING:  Dairy products of any kind   Cranberry juice Tomato juice / V8 juice   Grapefruit juice Orange juice     Red grape juice  Do not eat any solid foods, including such foods as: cereal, oatmeal, yogurt, fruits, vegetables, creamed soups, eggs, bread, crackers, pureed foods in a blender, etc.   HELPFUL HINTS FOR DRINKING PREP SOLUTION:   Make sure prep is extremely cold. Mix and refrigerate the the morning of the prep. You may also put in the freezer.   You may try mixing some Crystal Light or Country Time Lemonade if you prefer. Mix in small amounts; add more if necessary.  Try drinking through a straw  Rinse mouth with water or a mouthwash between glasses, to remove after-taste.  Try sipping on a cold beverage /ice/ popsicles between glasses  of prep.  Place a piece of sugar-free hard candy in mouth between glasses.  If you become nauseated, try consuming smaller amounts, or stretch out the time between glasses. Stop for 30-60 minutes, then slowly start back drinking.     OTHER INSTRUCTIONS  You will need a responsible adult at least 54 years of age to accompany you and drive you home. This person must remain in the waiting room during your procedure. The hospital will cancel your procedure if you do not have a responsible adult with you.   1. Wear loose fitting clothing that is easily removed. 2. Leave jewelry and other valuables at home.  3. Remove all body piercing jewelry and leave at home. 4. Total time from sign-in until discharge is approximately 2-3 hours. 5. You should go home directly after your procedure and rest. You can resume normal activities the day after your procedure. 6. The day of your procedure you should not:  Drive  Make legal decisions  Operate machinery  Drink alcohol  Return to work   You may call the office (Dept: [phone]) before 5:00pm, or page the doctor on call [phone]) after 5:00pm, for further instructions, if necessary.   Insurance Information YOU WILL NEED TO CHECK WITH YOUR INSURANCE COMPANY FOR THE BENEFITS OF COVERAGE YOU HAVE FOR THIS PROCEDURE.  UNFORTUNATELY, NOT ALL INSURANCE COMPANIES HAVE BENEFITS TO COVER ALL OR PART OF THESE TYPES OF PROCEDURES.  IT IS YOUR RESPONSIBILITY TO CHECK YOUR BENEFITS, HOWEVER, WE WILL BE GLAD TO ASSIST YOU WITH ANY CODES YOUR INSURANCE COMPANY MAY NEED.    PLEASE NOTE THAT MOST INSURANCE COMPANIES WILL NOT COVER A SCREENING COLONOSCOPY FOR PEOPLE UNDER THE AGE OF 50  IF YOU HAVE BCBS INSURANCE, YOU MAY HAVE BENEFITS FOR A SCREENING COLONOSCOPY BUT IF POLYPS ARE FOUND THE DIAGNOSIS WILL CHANGE AND THEN YOU MAY HAVE A DEDUCTIBLE THAT WILL NEED TO BE MET. SO PLEASE MAKE SURE YOU CHECK YOUR BENEFITS FOR A SCREENING COLONOSCOPY AS WELL AS  A DIAGNOSTIC COLONOSCOPY.

## 2020-02-18 NOTE — Progress Notes (Signed)
Gastroenterology Pre-Procedure Review  Request Date: 02/18/2020 Requesting Physician: Triage per Dr. Gala Romney, Last TCS done 11/03/2004, minimal hemorrhoids  PATIENT REVIEW QUESTIONS: The patient responded to the following health history questions as indicated:    1. Diabetes Melitis: no 2. Joint replacements in the past 12 months: no 3. Major health problems in the past 3 months: no 4. Has an artificial valve or MVP: no 5. Has a defibrillator: no 6. Has been advised in past to take antibiotics in advance of a procedure like teeth cleaning: no 7. Family history of colon cancer: no  8. Alcohol Use: yes, 1 glass of wine 3 times a week 9. Illicit drug Use: no 10. History of sleep apnea: yes, CPAP 11. History of coronary artery or other vascular stents placed within the last 12 months: no 12. History of any prior anesthesia complications: no 13. There is no height or weight on file to calculate BMI. ht: 5'9 wt: 183 lbs    MEDICATIONS & ALLERGIES:    Patient reports the following regarding taking any blood thinners:   Plavix? no Aspirin? no Coumadin? no Brilinta? no Xarelto? no Eliquis? no Pradaxa? no Savaysa? no Effient? no  Patient confirms/reports the following medications:  Current Outpatient Medications  Medication Sig Dispense Refill  . acyclovir (ZOVIRAX) 800 MG tablet TAKE 1 TABLET EVERY DAY 31 tablet 12  . amLODipine (NORVASC) 10 MG tablet Take 10 mg by mouth at bedtime.     . Cholecalciferol (VITAMIN D-3) 125 MCG (5000 UT) TABS Take by mouth daily.    . cloNIDine (CATAPRES) 0.1 MG tablet Take 0.1 mg by mouth at bedtime.     . Coenzyme Q10 (COQ10 PO) Take 1 capsule by mouth daily.    Mariane Baumgarten Sodium (STOOL SOFTENER) 100 MG capsule Take 100 mg by mouth at bedtime. Takes 2 tablets at bedtime.    Marland Kitchen EDARBI 80 MG TABS Take 1 tablet by mouth daily.    Marland Kitchen labetalol (NORMODYNE) 300 MG tablet Take 900 mg by mouth 2 (two) times daily.    . Multiple Vitamin (MULTIVITAMIN WITH  MINERALS) TABS tablet Take 1 tablet by mouth daily.    . Nutritional Supplements (DHEA PO) Take 30 mg by mouth as needed.     . Omega-3 Fatty Acids (OMEGA 3 500 PO) Take by mouth daily.    . Potassium Chloride ER 20 MEQ TBCR Take 2 tablets by mouth 2 (two) times daily.    . tadalafil (CIALIS) 5 MG tablet Take 5 mg by mouth as needed.     . triamterene-hydrochlorothiazide (MAXZIDE-25) 37.5-25 MG per tablet Take 1 tablet by mouth daily. (Patient not taking: Reported on 02/04/2020)     No current facility-administered medications for this visit.    Patient confirms/reports the following allergies:  Allergies  Allergen Reactions  . Ciprofloxacin Swelling    All flouroquinolones/Facial swelling  . Levaquin [Levofloxacin In D5w]     No orders of the defined types were placed in this encounter.   AUTHORIZATION INFORMATION Primary Insurance: [facility],  [place] #: [id number],  Group #: [id number] Pre-Cert / Josem Kaufmann required:  Pre-Cert / Auth #:   SCHEDULE INFORMATION: Procedure has been scheduled as follows:  Date: 04/06/2020, Time: 2:00 Location: [facility] with Dr. Gala Romney  This Gastroenterology Pre-Precedure Review Form is being routed to the following provider(s): Aliene Altes, PA

## 2020-02-19 NOTE — Progress Notes (Signed)
OK to schedule. ASA II 

## 2020-02-25 ENCOUNTER — Telehealth: Payer: Self-pay | Admitting: *Deleted

## 2020-02-25 NOTE — Telephone Encounter (Signed)
Submitted request for prior authorization to [facility]  It was denied.  Spoke to Christy Sartorius at [facility] (REF#: [id number]).  He said that pt must have procedure done at an ambulatory site instead of hospital.  [facility] said that we could appeal by submitting a peer to peer review [phone]).    Dr. Gala Romney:  Is that something you would like to do?  Please advise.

## 2020-02-28 NOTE — Telephone Encounter (Signed)
Probably going to see more and more of this.  Even if we appeal,  will pts out of pocket be significantly more than an ambulatory center?  If so, the smart thing for him to do is go somewhere else unfortunately.

## 2020-03-01 NOTE — Telephone Encounter (Signed)
Spoke to pt.  Informed him that [facility] is requesting his procedure to be done at an ambulatory center.  Discussed with pt that we could do a peer to peer but it could still end up costing him significantly more since we aren't an ambulatory center.  Pt decided that it would be best for him to do the procedure at an ambulatory center.  Informed him that I would reach out to [facility] to let them know that he would be calling them.  Pt voiced understanding and was given their number [phone]).

## 2020-03-01 NOTE — Telephone Encounter (Signed)
Lmom for pt to call me back. 

## 2020-03-02 NOTE — Telephone Encounter (Signed)
Communication noted.  

## 2020-03-02 NOTE — Telephone Encounter (Signed)
Called [facility] and spoke to receptionist to inform her of the information that [facility] gave me and to let her know that pt would be calling her.  She is going to reach out to pt.

## 2020-03-09 ENCOUNTER — Telehealth: Payer: Self-pay

## 2020-03-09 NOTE — Telephone Encounter (Signed)
Left msg on pt's vm to call back and schedule a direct colonoscopy

## 2020-03-10 ENCOUNTER — Encounter: Payer: Self-pay | Admitting: Gastroenterology

## 2020-03-15 ENCOUNTER — Other Ambulatory Visit: Payer: Self-pay

## 2020-03-15 ENCOUNTER — Ambulatory Visit
Admission: EM | Admit: 2020-03-15 | Discharge: 2020-03-15 | Disposition: A | Discharge status: Home / Self Care | Payer: 59 | Attending: Emergency Medicine | Admitting: Emergency Medicine

## 2020-03-15 DIAGNOSIS — Z1152 Encounter for screening for COVID-19: Secondary | ICD-10-CM

## 2020-03-15 NOTE — ED Triage Notes (Signed)
Pt is physician only wants testing

## 2020-03-16 LAB — NOVEL CORONAVIRUS, NAA: SARS-CoV-2, NAA: NOT DETECTED

## 2020-03-16 LAB — SARS-COV-2, NAA 2 DAY TAT

## 2020-04-05 ENCOUNTER — Other Ambulatory Visit (HOSPITAL_COMMUNITY): Payer: 59

## 2020-04-06 ENCOUNTER — Encounter (HOSPITAL_COMMUNITY): Payer: Self-pay

## 2020-04-06 ENCOUNTER — Ambulatory Visit (HOSPITAL_COMMUNITY): Admit: 2020-04-06 | Payer: 59 | Admitting: Internal Medicine

## 2020-04-06 SURGERY — COLONOSCOPY
Anesthesia: Moderate Sedation

## 2020-04-19 ENCOUNTER — Other Ambulatory Visit (HOSPITAL_COMMUNITY): Payer: 59

## 2020-04-27 ENCOUNTER — Ambulatory Visit (AMBULATORY_SURGERY_CENTER): Payer: 59

## 2020-04-27 ENCOUNTER — Other Ambulatory Visit: Payer: Self-pay

## 2020-04-27 VITALS — Ht 69.0 in | Wt 180.0 lb

## 2020-04-27 DIAGNOSIS — Z1211 Encounter for screening for malignant neoplasm of colon: Secondary | ICD-10-CM

## 2020-04-27 MED ORDER — NA SULFATE-K SULFATE-MG SULF 17.5-3.13-1.6 GM/177ML PO SOLN: 1.0000 | Freq: Once | ORAL | 0 refills | Status: AC

## 2020-04-27 NOTE — Progress Notes (Signed)
Pre Visit completed via phone-patient verified name, DOB, and address No egg or soy allergy known to patient  No issues with past sedation with any surgeries or procedures No intubation problems in the past  No FH of Malignant Hyperthermia No diet pills per patient No home 02 use per patient  No blood thinners per patient  Pt reports issues with constipation -- patient currently taking stool softener to assist with constipation  No A fib or A flutter  EMMI video to pt or via [place] 19 guidelines implemented in [place] today with Pt and RN  Coupon given to pt in [place] today , Code to Pharmacy  COVID vaccines completed on 05/2019 per pt;  Due to the COVID-19 pandemic we are asking patients to follow these guidelines. Please only bring one care partner. Please be aware that your care partner may wait in the car in the parking lot or if they feel like they will be too hot to wait in the car, they may wait in the lobby on the 4th floor. All care partners are required to wear a mask the entire time (we do not have any that we can provide them), they need to practice social distancing, and we will do a Covid check for all patient's and care partners when you arrive. Also we will check their temperature and your temperature. If the care partner waits in their car they need to stay in the parking lot the entire time and we will call them on their cell phone when the patient is ready for discharge so they can bring the car to the front of the building. Also all patient's will need to wear a mask into building.

## 2020-04-28 ENCOUNTER — Encounter: Payer: Self-pay | Admitting: Gastroenterology

## 2020-05-11 ENCOUNTER — Ambulatory Visit (AMBULATORY_SURGERY_CENTER): Payer: 59 | Admitting: Gastroenterology

## 2020-05-11 ENCOUNTER — Other Ambulatory Visit: Payer: Self-pay

## 2020-05-11 ENCOUNTER — Encounter: Payer: Self-pay | Admitting: Gastroenterology

## 2020-05-11 ENCOUNTER — Other Ambulatory Visit: Payer: Self-pay | Admitting: Physician Assistant

## 2020-05-11 VITALS — BP 148/87 | HR 62 | Temp 97.1°F | Resp 16 | Ht 69.0 in | Wt 180.0 lb

## 2020-05-11 DIAGNOSIS — Z1211 Encounter for screening for malignant neoplasm of colon: Secondary | ICD-10-CM | POA: Diagnosis not present

## 2020-05-11 DIAGNOSIS — K621 Rectal polyp: Secondary | ICD-10-CM | POA: Diagnosis not present

## 2020-05-11 DIAGNOSIS — K635 Polyp of colon: Secondary | ICD-10-CM | POA: Diagnosis not present

## 2020-05-11 DIAGNOSIS — D122 Benign neoplasm of ascending colon: Secondary | ICD-10-CM

## 2020-05-11 DIAGNOSIS — D129 Benign neoplasm of anus and anal canal: Secondary | ICD-10-CM

## 2020-05-11 MED ORDER — SODIUM CHLORIDE 0.9 % IV SOLN: 500.0000 mL | Freq: Once | INTRAVENOUS | Status: DC

## 2020-05-11 NOTE — Progress Notes (Signed)
PT taken to PACU. Monitors in place. VSS. Report given to RN. 

## 2020-05-11 NOTE — Progress Notes (Signed)
Called to room to assist during endoscopic procedure.  Patient ID and intended procedure confirmed with present staff. Received instructions for my participation in the procedure from the performing physician.  

## 2020-05-11 NOTE — Op Note (Signed)
[facility] Patient Name: Devin Wilson Procedure Date: 05/11/2020 2:31 PM MRN: [id number] Endoscopist: Gabriel Mansouraty , MD Age: 54 Referring MD:  Date of Birth: 09/10/1965 Gender: Male Account #: [id number] Procedure:                Colonoscopy Indications:              Screening for colorectal malignant neoplasm Medicines:                Monitored Anesthesia Care Procedure:                Pre-Anesthesia Assessment:                           - Prior to the procedure, a History and Physical                            was performed, and patient medications and                            allergies were reviewed. The patient's tolerance of                            previous anesthesia was also reviewed. The risks                            and benefits of the procedure and the sedation                            options and risks were discussed with the patient.                            All questions were answered, and informed consent                            was obtained. Prior Anticoagulants: The patient has                            taken no previous anticoagulant or antiplatelet                            agents. ASA Grade Assessment: II - A patient with                            mild systemic disease. After reviewing the risks                            and benefits, the patient was deemed in                            satisfactory condition to undergo the procedure.                           After obtaining informed consent, the colonoscope                              was passed under direct vision. Throughout the                            procedure, the patient's blood pressure, pulse, and                            oxygen saturations were monitored continuously. The                            Olympus PCF-H190DL (#[id number]) Colonoscope was                            introduced through the anus and advanced to the the                            cecum,  identified by appendiceal orifice and                            ileocecal valve. The colonoscopy was somewhat                            difficult due to a redundant colon and significant                            looping. Successful completion of the procedure was                            aided by changing the patient's position, using                            manual pressure, withdrawing and reinserting the                            scope, straightening and shortening the scope to                            obtain bowel loop reduction and using scope                            torsion. The patient tolerated the procedure. The                            quality of the bowel preparation was adequate. The                            ileocecal valve, appendiceal orifice, and rectum                            were photographed. Scope In: 2:38:31 PM Scope Out: 3:00:38 PM Scope Withdrawal Time: 0 hours 13 minutes 24 seconds  Total Procedure Duration: 0 hours 22 minutes 7 seconds  Findings:                 The digital rectal exam findings include  hemorrhoids. Pertinent negatives include no                            palpable rectal lesions.                           The colon (entire examined portion) revealed                            excessive looping and redundancy.                           Two sessile polyps were found in the rectum and                            ascending colon. The polyps were 3 to 4 mm in size.                            These polyps were removed with a cold snare.                            Resection and retrieval were complete.                           Normal mucosa was found in the entire colon                            otherwise.                           Non-bleeding non-thrombosed external and internal                            hemorrhoids were found during retroflexion, during                            perianal exam and  during digital exam. The                            hemorrhoids were Grade II (internal hemorrhoids                            that prolapse but reduce spontaneously). Complications:            No immediate complications. Estimated Blood Loss:     Estimated blood loss was minimal. Impression:               - Hemorrhoids found on digital rectal exam.                           - There was significant looping of the colon.                           - Two 3 to 4 mm polyps in the rectum and in the                              ascending colon, removed with a cold snare.                            Resected and retrieved.                           - Normal mucosa in the entire examined colon                            otherwise.                           - Non-bleeding non-thrombosed external and internal                            hemorrhoids. Recommendation:           - The patient will be observed post-procedure,                            until all discharge criteria are met.                           - Discharge patient to home.                           - Patient has a contact number available for                            emergencies. The signs and symptoms of potential                            delayed complications were discussed with the                            patient. Return to normal activities tomorrow.                            Written discharge instructions were provided to the                            patient.                           - High fiber diet.                           - Continue present medications.                           - Await pathology results.                           - Repeat colonoscopy in likely 7/10 years for                            surveillance based on pathology results based on                              adenomatous tissue being found.                           - The findings and recommendations were discussed                            with  the patient.                           - The findings and recommendations were discussed                            with the patient's family. Justice Britain, MD 05/11/2020 3:14:17 PM

## 2020-05-11 NOTE — Progress Notes (Signed)
Pt's states no medical or surgical changes since previsit or office visit. 

## 2020-05-11 NOTE — Patient Instructions (Signed)
Please read handouts provided. Continue present medications. Await pathology results. High Fiber Diet.   YOU HAD AN ENDOSCOPIC PROCEDURE TODAY AT THE [facility]:   Refer to the procedure report that was given to you for any specific questions about what was found during the examination.  If the procedure report does not answer your questions, please call your gastroenterologist to clarify.  If you requested that your care partner not be given the details of your procedure findings, then the procedure report has been included in a sealed envelope for you to review at your convenience later.  YOU SHOULD EXPECT: Some feelings of bloating in the abdomen. Passage of more gas than usual.  Walking can help get rid of the air that was put into your GI tract during the procedure and reduce the bloating. If you had a lower endoscopy (such as a colonoscopy or flexible sigmoidoscopy) you may notice spotting of blood in your stool or on the toilet paper. If you underwent a bowel prep for your procedure, you may not have a normal bowel movement for a few days.  Please Note:  You might notice some irritation and congestion in your nose or some drainage.  This is from the oxygen used during your procedure.  There is no need for concern and it should clear up in a day or so.  SYMPTOMS TO REPORT IMMEDIATELY:  Following lower endoscopy (colonoscopy or flexible sigmoidoscopy):  Excessive amounts of blood in the stool  Significant tenderness or worsening of abdominal pains  Swelling of the abdomen that is new, acute  Fever of 100F or higher   For urgent or emergent issues, a gastroenterologist can be reached at any hour by calling [phone]. Do not use MyChart messaging for urgent concerns.    DIET:  We do recommend a small meal at first, but then you may proceed to your regular diet.  Drink plenty of fluids but you should avoid alcoholic beverages for 24 hours.  ACTIVITY:  You should plan  to take it easy for the rest of today and you should NOT DRIVE or use heavy machinery until tomorrow (because of the sedation medicines used during the test).    FOLLOW UP: Our staff will call the number listed on your records 48-72 hours following your procedure to check on you and address any questions or concerns that you may have regarding the information given to you following your procedure. If we do not reach you, we will leave a message.  We will attempt to reach you two times.  During this call, we will ask if you have developed any symptoms of COVID 19. If you develop any symptoms (ie: fever, flu-like symptoms, shortness of breath, cough etc.) before then, please call [phone].  If you test positive for Covid 19 in the 2 weeks post procedure, please call and report this information to us.    If any biopsies were taken you will be contacted by phone or by letter within the next 1-3 weeks.  Please call us at [phone] if you have not heard about the biopsies in 3 weeks.    SIGNATURES/CONFIDENTIALITY: You and/or your care partner have signed paperwork which will be entered into your electronic medical record.  These signatures attest to the fact that that the information above on your After Visit Summary has been reviewed and is understood.  Full responsibility of the confidentiality of this discharge information lies with you and/or your care-partner.  

## 2020-05-12 ENCOUNTER — Ambulatory Visit: Payer: Self-pay | Admitting: Physician Assistant

## 2020-05-12 DIAGNOSIS — Z113 Encounter for screening for infections with a predominantly sexual mode of transmission: Secondary | ICD-10-CM

## 2020-05-12 DIAGNOSIS — Z299 Encounter for prophylactic measures, unspecified: Secondary | ICD-10-CM

## 2020-05-13 ENCOUNTER — Telehealth: Payer: Self-pay | Admitting: *Deleted

## 2020-05-13 ENCOUNTER — Encounter: Payer: Self-pay | Admitting: Physician Assistant

## 2020-05-13 LAB — GRAM STAIN

## 2020-05-13 NOTE — Progress Notes (Signed)
[facility] Department STI clinic/screening visit  Subjective:  Devin Wilson is a 54 y.o. male being seen today for an STI screening visit. The patient reports they do not have symptoms.    Patient has the following medical conditions:   Patient Active Problem List   Diagnosis Date Noted  . Exposure to sexually transmitted disease (STD) 12/08/2018  . Gonorrhea 11/27/2018  . S/P arthroscopy of left knee 03/24/18 04/10/2018  . Old complex tear of medial meniscus of left knee   . 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 Primary osteoarthritis of left knee   . Stage 2 chronic kidney disease 01/13/2018  . HSV-2 (herpes simplex virus 2) infection 12/20/2017  . Medial meniscus, posterior horn derangement 02/20/2012  . GERD (gastroesophageal reflux disease)   . Chest pain on exertion 08/10/2011  . Primary hypertension 08/10/2011  . Unstable angina ([place]) 08/10/2011  . Thalassemia minor   . SPRAIN AND STRAIN OF METACARPOPHALANGEAL OF HAND 08/16/2009     Chief Complaint  Patient presents with  . SEXUALLY TRANSMITTED DISEASE    screening    HPI  Patient reports that he is not having any symptoms but would like a screening today.  Denies changes to his history and medicines since last visit in 01/2020.  Reports that he has not gotten a refill on his Acyclovir and states that his pharmacy stated that they had never gotten the refill authorization that was sent in September.   See flowsheet for further details and programmatic requirements.    The following portions of the patient's history were reviewed and updated as appropriate: allergies, current medications, past medical history, past social history, past surgical history and problem list.  Objective:  There were no vitals filed for this visit.  Physical Exam Constitutional:      General: He is not in acute distress.    Appearance: Normal appearance.  HENT:     Head: Normocephalic and  atraumatic.     Mouth/Throat:     Mouth: Mucous membranes are moist.     Pharynx: Oropharynx is clear. No oropharyngeal exudate or posterior oropharyngeal erythema.  Eyes:     Conjunctiva/sclera: Conjunctivae normal.  Pulmonary:     Effort: Pulmonary effort is normal.  Abdominal:     Palpations: Abdomen is soft. There is no mass.     Tenderness: There is no abdominal tenderness. There is no guarding or rebound.  Genitourinary:    Penis: Normal.      Testes: Normal.  Musculoskeletal:     Cervical back: Neck supple. No tenderness.  Skin:    General: Skin is warm and dry.     Findings: No bruising, erythema, lesion or rash.  Neurological:     Mental Status: He is alert and oriented to person, place, and time.  Psychiatric:        Mood and Affect: Mood normal.        Behavior: Behavior normal.        Thought Content: Thought content normal.        Judgment: Judgment normal.       Assessment and Plan:  Devin Wilson is a 54 y.o. male presenting to the [facility] for STI screening  1. Screening for STD (sexually transmitted disease) Patient into clinic without symptoms. Rec condoms with all sex. Await test results.  Counseled that RN will call if needs to RTC for treatment once results are back. - Gram stain -  Gonococcus culture - HIV [facility] - Syphilis Serology, [facility]  2. Prophylactic measure Phone call to [facility] at [phone], and spoke with pharmacist.  Verbal order for Acyclovir 800 mg #90 1 po daily with refills x 3 given and repeated back correctly by pharmacist.     No follow-ups on file.  No future appointments.  Jerene Dilling, PA

## 2020-05-13 NOTE — Telephone Encounter (Signed)
First attempt, left VM.  

## 2020-05-13 NOTE — Telephone Encounter (Signed)
  Follow up Call-  Call back number 05/11/2020  Post procedure Call Back phone  # [phone]  Permission to leave phone message Yes  Some recent data might be hidden     Patient questions:  Do you have a fever, pain , or abdominal swelling? No. Pain Score  0 *  Have you tolerated food without any problems? Yes.    Have you been able to return to your normal activities? Yes.    Do you have any questions about your discharge instructions: Diet   No. Medications  No. Follow up visit  No.  Do you have questions or concerns about your Care? No.  Actions: * If pain score is 4 or above: No action needed, pain <4.  1. Have you developed a fever since your procedure? no  2.   Have you had an respiratory symptoms (SOB or cough) since your procedure? no  3.   Have you tested positive for COVID 19 since your procedure no  4.   Have you had any family members/close contacts diagnosed with the COVID 19 since your procedure?  no   If yes to any of these questions please route to Joylene John, RN and Joella Prince, RN

## 2020-05-17 LAB — GONOCOCCUS CULTURE

## 2020-05-19 ENCOUNTER — Ambulatory Visit
Admission: EM | Admit: 2020-05-19 | Discharge: 2020-05-19 | Disposition: A | Discharge status: Home / Self Care | Payer: 59 | Attending: Emergency Medicine | Admitting: Emergency Medicine

## 2020-05-19 ENCOUNTER — Other Ambulatory Visit: Payer: Self-pay

## 2020-05-19 DIAGNOSIS — Z1152 Encounter for screening for COVID-19: Secondary | ICD-10-CM

## 2020-05-19 NOTE — ED Triage Notes (Signed)
Needs covid test for travel

## 2020-05-21 LAB — NOVEL CORONAVIRUS, NAA: SARS-CoV-2, NAA: NOT DETECTED

## 2020-05-21 LAB — SARS-COV-2, NAA 2 DAY TAT

## 2020-05-23 ENCOUNTER — Encounter: Payer: Self-pay | Admitting: Gastroenterology

## 2020-10-25 ENCOUNTER — Ambulatory Visit
Admission: EM | Admit: 2020-10-25 | Discharge: 2020-10-25 | Disposition: A | Discharge status: Home / Self Care | Payer: 59 | Attending: Family Medicine | Admitting: Family Medicine

## 2020-10-25 DIAGNOSIS — Z1152 Encounter for screening for COVID-19: Secondary | ICD-10-CM

## 2020-10-25 NOTE — ED Triage Notes (Signed)
Needs covid swab for travel

## 2020-10-26 LAB — NOVEL CORONAVIRUS, NAA: SARS-CoV-2, NAA: NOT DETECTED

## 2020-10-26 LAB — SARS-COV-2, NAA 2 DAY TAT

## 2020-11-22 ENCOUNTER — Ambulatory Visit
Admission: EM | Admit: 2020-11-22 | Discharge: 2020-11-22 | Disposition: A | Discharge status: Home / Self Care | Payer: 59 | Attending: Physician Assistant | Admitting: Physician Assistant

## 2020-11-22 DIAGNOSIS — Z1152 Encounter for screening for COVID-19: Secondary | ICD-10-CM | POA: Diagnosis not present

## 2020-11-22 NOTE — ED Triage Notes (Signed)
Covid test for travel

## 2020-11-23 LAB — NOVEL CORONAVIRUS, NAA

## 2021-01-09 ENCOUNTER — Emergency Department (HOSPITAL_COMMUNITY)
Admission: EM | Admit: 2021-01-09 | Discharge: 2021-01-09 | Disposition: A | Discharge status: Home / Self Care | Payer: 59 | Attending: Emergency Medicine | Admitting: Emergency Medicine

## 2021-01-09 ENCOUNTER — Encounter (HOSPITAL_COMMUNITY): Payer: Self-pay

## 2021-01-09 ENCOUNTER — Emergency Department (HOSPITAL_COMMUNITY): Payer: 59

## 2021-01-09 ENCOUNTER — Telehealth: Payer: Self-pay

## 2021-01-09 ENCOUNTER — Other Ambulatory Visit: Payer: Self-pay

## 2021-01-09 DIAGNOSIS — Z79899 Other long term (current) drug therapy: Secondary | ICD-10-CM | POA: Diagnosis not present

## 2021-01-09 DIAGNOSIS — R072 Precordial pain: Secondary | ICD-10-CM | POA: Diagnosis not present

## 2021-01-09 DIAGNOSIS — R0789 Other chest pain: Secondary | ICD-10-CM

## 2021-01-09 DIAGNOSIS — I129 Hypertensive chronic kidney disease with stage 1 through stage 4 chronic kidney disease, or unspecified chronic kidney disease: Secondary | ICD-10-CM | POA: Insufficient documentation

## 2021-01-09 DIAGNOSIS — Z20822 Contact with and (suspected) exposure to covid-19: Secondary | ICD-10-CM | POA: Insufficient documentation

## 2021-01-09 DIAGNOSIS — N182 Chronic kidney disease, stage 2 (mild): Secondary | ICD-10-CM | POA: Diagnosis not present

## 2021-01-09 DIAGNOSIS — R079 Chest pain, unspecified: Secondary | ICD-10-CM

## 2021-01-09 HISTORY — DX: Hyperlipidemia, unspecified: E78.5

## 2021-01-09 HISTORY — DX: Thalassemia minor: D56.3

## 2021-01-09 HISTORY — DX: Other chest pain: R07.89

## 2021-01-09 LAB — COMPREHENSIVE METABOLIC PANEL
ALT: 21 U/L (ref 0–44)
AST: 22 U/L (ref 15–41)
Albumin: 3.9 g/dL (ref 3.5–5.0)
Alkaline Phosphatase: 139 U/L — ABNORMAL HIGH (ref 38–126)
Anion gap: 7 (ref 5–15)
BUN: 19 mg/dL (ref 6–20)
CO2: 26 mmol/L (ref 22–32)
Calcium: 9 mg/dL (ref 8.9–10.3)
Chloride: 105 mmol/L (ref 98–111)
Creatinine, Ser: 1.63 mg/dL — ABNORMAL HIGH (ref 0.61–1.24)
GFR, Estimated: 49 mL/min — ABNORMAL LOW (ref >60)
Glucose, Bld: 93 mg/dL (ref 70–99)
Potassium: 3.4 mmol/L — ABNORMAL LOW (ref 3.5–5.1)
Sodium: 138 mmol/L (ref 135–145)
Total Bilirubin: 1 mg/dL (ref 0.3–1.2)
Total Protein: 7.4 g/dL (ref 6.5–8.1)

## 2021-01-09 LAB — CBC WITH DIFFERENTIAL/PLATELET
Abs Immature Granulocytes: 0.02 10*3/uL (ref 0.00–0.07)
Basophils Absolute: 0 10*3/uL (ref 0.0–0.1)
Basophils Relative: 1 %
Eosinophils Absolute: 0.1 10*3/uL (ref 0.0–0.5)
Eosinophils Relative: 2 %
HCT: 33 % — ABNORMAL LOW (ref 39.0–52.0)
Hemoglobin: 10 g/dL — ABNORMAL LOW (ref 13.0–17.0)
Immature Granulocytes: 1 %
Lymphocytes Relative: 16 %
Lymphs Abs: 0.7 10*3/uL (ref 0.7–4.0)
MCH: 23.6 pg — ABNORMAL LOW (ref 26.0–34.0)
MCHC: 30.3 g/dL (ref 30.0–36.0)
MCV: 77.8 fL — ABNORMAL LOW (ref 80.0–100.0)
Monocytes Absolute: 0.9 10*3/uL (ref 0.1–1.0)
Monocytes Relative: 20 %
Neutro Abs: 2.6 10*3/uL (ref 1.7–7.7)
Neutrophils Relative %: 60 %
Platelets: 188 10*3/uL (ref 150–400)
RBC: 4.24 MIL/uL (ref 4.22–5.81)
RDW: 15.6 % — ABNORMAL HIGH (ref 11.5–15.5)
WBC: 4.3 10*3/uL (ref 4.0–10.5)
nRBC: 0 % (ref 0.0–0.2)

## 2021-01-09 LAB — I-STAT CHEM 8, ED
BUN: 17 mg/dL (ref 6–20)
Calcium, Ion: 1.08 mmol/L — ABNORMAL LOW (ref 1.15–1.40)
Chloride: 105 mmol/L (ref 98–111)
Creatinine, Ser: 1.7 mg/dL — ABNORMAL HIGH (ref 0.61–1.24)
Glucose, Bld: 93 mg/dL (ref 70–99)
HCT: 31 % — ABNORMAL LOW (ref 39.0–52.0)
Hemoglobin: 10.5 g/dL — ABNORMAL LOW (ref 13.0–17.0)
Potassium: 3.4 mmol/L — ABNORMAL LOW (ref 3.5–5.1)
Sodium: 141 mmol/L (ref 135–145)
TCO2: 30 mmol/L (ref 22–32)

## 2021-01-09 LAB — D-DIMER, QUANTITATIVE: D-Dimer, Quant: 1.24 ug/mL-FEU — ABNORMAL HIGH (ref 0.00–0.50)

## 2021-01-09 LAB — LIPASE, BLOOD: Lipase: 40 U/L (ref 11–51)

## 2021-01-09 LAB — LIPID PANEL
Cholesterol: 144 mg/dL (ref 0–200)
HDL: 31 mg/dL — ABNORMAL LOW (ref >40)
LDL Cholesterol: 101 mg/dL — ABNORMAL HIGH (ref 0–99)
Total CHOL/HDL Ratio: 4.6 RATIO
Triglycerides: 60 mg/dL (ref <150)
VLDL: 12 mg/dL (ref 0–40)

## 2021-01-09 LAB — RESP PANEL BY RT-PCR (FLU A&B, COVID) ARPGX2
Influenza A by PCR: NEGATIVE
Influenza B by PCR: NEGATIVE
SARS Coronavirus 2 by RT PCR: NEGATIVE

## 2021-01-09 LAB — SEDIMENTATION RATE: Sed Rate: 18 mm/hr — ABNORMAL HIGH (ref 0–16)

## 2021-01-09 LAB — TROPONIN I (HIGH SENSITIVITY)
Troponin I (High Sensitivity): 4 ng/L (ref <18)
Troponin I (High Sensitivity): 4 ng/L (ref <18)

## 2021-01-09 MED ORDER — PANTOPRAZOLE SODIUM 40 MG IV SOLR
40.0000 mg | Freq: Once | INTRAVENOUS | Status: AC
  Administered 2021-01-09: 40 mg via INTRAVENOUS
  Filled 2021-01-09: qty 40

## 2021-01-09 MED ORDER — IOHEXOL 350 MG/ML SOLN
80.0000 mL | Freq: Once | INTRAVENOUS | Status: AC | PRN
  Administered 2021-01-09: 80 mL via INTRAVENOUS

## 2021-01-09 MED ORDER — LIDOCAINE VISCOUS HCL 2 % MT SOLN
15.0000 mL | Freq: Once | OROMUCOSAL | Status: AC
  Administered 2021-01-09: 15 mL via ORAL
  Filled 2021-01-09: qty 15

## 2021-01-09 MED ORDER — ALUM & MAG HYDROXIDE-SIMETH 200-200-20 MG/5ML PO SUSP
30.0000 mL | Freq: Once | ORAL | Status: AC
  Administered 2021-01-09: 30 mL via ORAL
  Filled 2021-01-09: qty 30

## 2021-01-09 MED ORDER — ACETAMINOPHEN 325 MG PO TABS
650.0000 mg | ORAL_TABLET | Freq: Once | ORAL | Status: AC
  Administered 2021-01-09: 650 mg via ORAL
  Filled 2021-01-09: qty 2

## 2021-01-09 MED ORDER — PANTOPRAZOLE SODIUM 20 MG PO TBEC: 20.0000 mg | DELAYED_RELEASE_TABLET | Freq: Every day | ORAL | 2 refills | Status: DC

## 2021-01-09 MED ORDER — NITROGLYCERIN 0.4 MG SL SUBL
0.4000 mg | SUBLINGUAL_TABLET | Freq: Once | SUBLINGUAL | Status: AC
  Administered 2021-01-09: 0.4 mg via SUBLINGUAL
  Filled 2021-01-09: qty 1

## 2021-01-09 NOTE — Discharge Instructions (Addendum)
Follow-up to get your stress test done.  Also follow-up with your GI doctor family doctor if the Protonix does not help

## 2021-01-09 NOTE — ED Notes (Signed)
MD in room at time of triage for MSE. 

## 2021-01-09 NOTE — Consult Note (Signed)
Cardiology Consultation:   Patient ID: Devin Wilson MRN: [id number]; DOB: [birth year]  Admit date: 01/09/2021 Date of Consult: 01/09/2021  PCP:  Redmond School, MD   [facility] Providers Cardiologist:  None   {    Patient Profile:   Devin Wilson is a 55 y.o. male with a hx of chest discomfort which began in his back 2 days ago.  Who is being seen 01/09/2021 for the evaluation of chest discomfort at the request of emergency room physician.  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  He was in his usual state of health till Saturday afternoon at which time he developed the onset of some back discomfort which gradually went to the front of his chest.  Despite this he was able to do a workout.  He noticed no change with eating but did notice perhaps mild change with thing making the symptoms worse.  Difficult positional issue.  The discomfort began his back radiating to the anterior chest but did not go elsewhere.  He did have a slight sensation of elevation of temperature and slight chills and sweats over the weekend.  He thinks this may have represented a mild viral illness. Historically had a cardiac cath 10 years ago and was told everything was okay.  He exercises regularly.  He has not been troubled with chest discomfort, exertional dyspnea, palpitations, dizziness, presyncope.  Slight tendency for edema which he attributes to his antihypertensive medications. He has followed by nephrology as well as cardiology.   Past Medical History:  Diagnosis Date   Angina    Bradycardia, sinus    Chest pain    a. 3.15.2013 Cath: Minor [facility]regs   Chronic kidney disease    mild kidney failure stage 2-3   GERD (gastroesophageal reflux disease)    OTC PRN meds   Hyperlipidemia    on meds   Hypertension    on meds   Migraines    Sleep apnea    auto set-CPAP   Thalassemia minor    hx of     Past Surgical History:  Procedure Laterality Date   CARDIAC CATHETERIZATION  08/10/11   COLONOSCOPY  2006   Dr.Rourk @ [facility]   HERNIA REPAIR     "in infancy; umbilical"   KNEE ARTHROSCOPY WITH MEDIAL MENISECTOMY Left 03/24/2018   Procedure: KNEE ARTHROSCOPY WITH MEDIAL MENISECTOMY,LATERAL MENISECTOMY, CHONDROPLASTY MEDIAL FEMUR;  Surgeon: [name], MD;  Location: [facility];  Service: Orthopedics;  Laterality: Left;   LEFT HEART CATHETERIZATION WITH CORONARY ANGIOGRAM N/A 08/10/2011   Procedure: LEFT HEART CATHETERIZATION WITH CORONARY ANGIOGRAM;  Surgeon: [name], MD;  Location: [facility] CATH LAB;  Service: Cardiovascular;  Laterality: N/A;   VASECTOMY     WISDOM TOOTH EXTRACTION       Home Medications:  Prior to Admission medications   Medication Sig Start Date End Date Taking? Authorizing Provider  acyclovir (ZOVIRAX) 800 MG tablet TAKE 1 TABLET EVERY DAY 02/02/20   Jerene Dilling, PA  amLODipine (NORVASC) 10 MG tablet Take 10 mg by mouth at bedtime.  10/31/17   [provider]  Cholecalciferol (VITAMIN D-3) 125 MCG (5000 UT) TABS Take by mouth daily.    [provider]  cloNIDine (CATAPRES) 0.1 MG tablet Take 0.1 mg by mouth at bedtime.     [provider]  Coenzyme Q10 (COQ10 PO) Take 1 capsule by mouth daily.    [provider]  Docusate Sodium 100 MG capsule Take 100 mg by mouth at bedtime. Takes 2 tablets at bedtime.    [provider]  EDARBI 80 MG TABS Take 1 tablet by mouth daily. 02/03/20   [provider]  labetalol (NORMODYNE) 300 MG tablet Take 900 mg by mouth 2 (two) times daily. 10/31/17   [provider]  Multiple Vitamin (MULTIVITAMIN WITH MINERALS) TABS tablet Take 1 tablet by mouth daily.    [provider]  Nutritional Supplements (DHEA PO) Take 30 mg by mouth as needed.     [provider]  Omega-3 Fatty Acids (OMEGA 3 500 PO) Take by mouth daily.    [provider]   Potassium Chloride ER 20 MEQ TBCR Take 2 tablets by mouth 2 (two) times daily. 01/12/20   [provider]  tadalafil (CIALIS) 5 MG tablet Take 5 mg by mouth as needed.     [provider]  triamterene-hydrochlorothiazide (MAXZIDE-25) 37.5-25 MG per tablet Take 1 tablet by mouth daily. Patient not taking: No sig reported    [provider]    Inpatient Medications: Scheduled Meds:  Continuous Infusions:  PRN Meds:   Allergies:    Allergies  Allergen Reactions   Ciprofloxacin Swelling    All flouroquinolones/Facial swelling   Levaquin [Levofloxacin In D5w]     Social History:   Social History   Socioeconomic History   Marital status: Married    Spouse name: Not on file   Number of children: Not on file   Years of education: Not on file   Highest education level: Not on file  Occupational History   Occupation: Physician    Comment: Neurologist  Tobacco Use   Smoking status: Never   Smokeless tobacco: Never  Vaping Use   Vaping Use: Never used  Substance and Sexual Activity   Alcohol use: Yes    Alcohol/week: 6.0 standard drinks    Types: 1 Glasses of wine, 5 Standard drinks or equivalent per week   Drug use: No   Sexual activity: Yes    Partners: Female  Other Topics Concern   Not on file  Social History Narrative   Not on file   Social Determinants of Health   Financial Resource Strain: Not on file  Food Insecurity: Not on file  Transportation Needs: Not on file  Physical Activity: Not on file  Stress: Not on file  Social Connections: Not on file  Intimate Partner Violence: Not on file    Family History:   Father had a thoracic aortic aneurysm and because of high risk for surgery at that time declined surgery and died as result of rupture. Family History  Problem Relation Age of Onset   Aneurysm Father    Colon polyps Neg Hx    Colon cancer Neg Hx    Esophageal cancer Neg Hx    Rectal cancer Neg Hx    Stomach cancer Neg  Hx      ROS:  Please see the history of present illness.  Occasional reflux helped by an acids. All other ROS reviewed and negative.     Physical Exam/Data:   Vitals:   01/09/21 0900 01/09/21 0915 01/09/21 0930 01/09/21 1000  BP: 136/76 132/79 125/75   Pulse: (!) 56 (!) 55 (!) 54 (!) 43  Resp:  (!) 22 20   Temp:      TempSrc:      SpO2: 100% 100% 100% 100%  Weight:  Height:       No intake or output data in the 24 hours ending 01/09/21 1025 Last 3 Weights 01/09/2021 05/11/2020 04/27/2020  Weight (lbs) 179 lb 14.3 oz 180 lb 180 lb  Weight (kg) 81.6 kg 81.647 kg 81.647 kg     Body mass index is 26.57 kg/m.  General:  Well nourished, well developed, in no acute distress HEENT: normal Lymph: no adenopathy Neck: no JVD Endocrine:  No thryomegaly Vascular: No carotid bruits; FA pulses 2+ bilaterally without bruits  Cardiac:  normal S1, S2; RRR; no murmur no rub. Lungs:  clear to auscultation bilaterally, no wheezing, rhonchi or rales.  With deep breath patient said the symptoms slightly worse. Abd: soft, nontender, no hepatomegaly  Ext: no edema Musculoskeletal:  No deformities, BUE and BLE strength normal and equal Skin: warm and dry  Neuro:   no focal abnormalities noted.  Double speech pattern Psych:  Normal affect   EKG:  The EKG was personally reviewed and demonstrates: Normal sinus rhythm, no acute ST-T changes Telemetry:  Telemetry was personally reviewed and demonstrates: Sinus rhythm with sinus bradycardia  Relevant CV Studies: Chest x-ray: Slight enlargement of cardiac silhouette but clear lung fields.  No significant change since chest x-ray 2019. CT Angio Cardiovascular: No intramural hematoma on precontrast imaging. Scattered atherosclerotic calcifications in coronary arteries. Aorta normal caliber. No evidence of aneurysm or dissection on postcontrast imaging. Thoracic vascular structures patent.   Laboratory Data: Slight elevation of D-dimer High  Sensitivity Troponin:   Recent Labs  Lab 01/09/21 0757  TROPONINIHS 4     Chemistry Recent Labs  Lab 01/09/21 0757 01/09/21 0828  NA 138 141  K 3.4* 3.4*  CL 105 105  CO2 26  --   GLUCOSE 93 93  BUN 19 17  CREATININE 1.63* 1.70*  CALCIUM 9.0  --   GFRNONAA 49*  --   ANIONGAP 7  --     Recent Labs  Lab 01/09/21 0757  PROT 7.4  ALBUMIN 3.9  AST 22  ALT 21  ALKPHOS 139*  BILITOT 1.0   Hematology Recent Labs  Lab 01/09/21 0757 01/09/21 0828  WBC 4.3  --   RBC 4.24  --   HGB 10.0* 10.5*  HCT 33.0* 31.0*  MCV 77.8*  --   MCH 23.6*  --   MCHC 30.3  --   RDW 15.6*  --   PLT 188  --    BNPNo results for input(s): BNP, PROBNP in the last 168 hours.  DDimer  Recent Labs  Lab 01/09/21 0757  DDIMER 1.24*     Radiology/Studies:  DG Chest Port 1 View  Result Date: 01/09/2021 CLINICAL DATA:  Pain EXAM: PORTABLE CHEST 1 VIEW COMPARISON:  Chest radiograph 03/20/2018 FINDINGS: The heart is mildly enlarged, not significantly changed. The mediastinal contours are otherwise within normal limits. The lungs are clear, with no focal consolidation or pulmonary edema. There may be a trace right pleural effusion. There is no significant left effusion. There is no pneumothorax. The bones are unremarkable. IMPRESSION: 1. Possible trace right pleural effusion. 2. Cardiomegaly, not significantly changed since 2019. 3. Otherwise no radiographic evidence of acute cardiopulmonary process. Electronically Signed   By: Valetta Mole M.D.   On: 01/09/2021 08:32   CT Angio Chest/Abd/Pel for Dissection W and/or Wo Contrast  Addendum Date: 01/09/2021   ADDENDUM REPORT: 01/09/2021 09:56 ADDENDUM: Discussed with Dr. Roderic Palau. Specifically, no gross evidence of pulmonary embolism. Electronically Signed   By: Crist Infante.D.  On: 01/09/2021 09:56   Result Date: 01/09/2021 CLINICAL DATA:  Anterior chest pain 24 hours duration, chest or back pain aortic dissection suspected, history GERD,  hypertension, mild chronic kidney disease EXAM: CT ANGIOGRAPHY CHEST, ABDOMEN AND PELVIS TECHNIQUE: Non-contrast CT of the chest was initially obtained. Multidetector CT imaging through the chest, abdomen and pelvis was performed using the standard protocol during bolus administration of intravenous contrast. Multiplanar reconstructed images and MIPs were obtained and reviewed to evaluate the vascular anatomy. CONTRAST:  101m OMNIPAQUE IOHEXOL 350 MG/ML SOLN IV COMPARISON:  CT abdomen and pelvis 03/14/2018, CT angio chest 08/10/2011 FINDINGS: CTA CHEST FINDINGS Cardiovascular: No intramural hematoma on precontrast imaging. Scattered atherosclerotic calcifications in coronary arteries. Aorta normal caliber. No evidence of aneurysm or dissection on postcontrast imaging. Thoracic vascular structures patent. Mediastinum/Nodes: Base of cervical region normal appearance. No thoracic adenopathy. Esophagus unremarkable. Lungs/Pleura: Small BILATERAL pleural effusions. Minimal compressive atelectasis of the posterior lower lobes greater on LEFT. No pulmonary infiltrate or pneumothorax. Tiny subpleural nodular density RIGHT upper lobe 3 mm diameter image 56 nonspecific but new since 2013. No additional pulmonary nodules. Musculoskeletal: Normal appearance Review of the MIP images confirms the above findings. CTA ABDOMEN AND PELVIS FINDINGS VASCULAR Aorta: Normal caliber without aneurysm or dissection. Celiac: Widely patent SMA: Widely patent Renals: Mildly patent IMA: Patent Inflow: Atherosclerotic calcifications at the common iliac arteries bilaterally. Patent. Veins: Suboptimally opacified at timing of CT a imaging Review of the MIP images confirms the above findings. NON-VASCULAR Hepatobiliary: Gallbladder and liver normal appearance Pancreas: Normal appearance Spleen: Normal appearance Adrenals/Urinary Tract: Adrenal glands normal appearance. Cyst at upper pole LEFT kidney posteriorly 2.4 x 2.0 cm image 94. Kidneys,  ureters, and bladder otherwise normal appearance Stomach/Bowel: Appendix questionably visualized, unremarkable. Stool throughout colon. Stomach decompressed. Bowel loops otherwise unremarkable for exam lacking GI contrast and adequate distension. Lymphatic: No adenopathy Reproductive: Unremarkable. Other: No free air or free fluid.  Umbilical hernia containing fat Musculoskeletal: Unremarkable Review of the MIP images confirms the above findings. IMPRESSION: No evidence of aortic aneurysm or dissection. Scattered atherosclerotic calcifications including coronary arteries. Small BILATERAL pleural effusions with minimal compressive atelectasis of the posterior lower lobes greater on LEFT. No acute intra-abdominal or intrapelvic abnormalities. Umbilical hernia containing fat. Tiny subpleural nodular density RIGHT upper lobe 3 mm diameter nonspecific but new since 2013; no follow-up needed if patient is low-risk. Non-contrast chest CT can be considered in 12 months if patient is high-risk. This recommendation follows the consensus statement: Guidelines for Management of Incidental Pulmonary Nodules Detected on CT Images: From the Fleischner Society 2017; Radiology 2017; 284:228-243. Electronically Signed: By: MLavonia DanaM.D. On: 01/09/2021 09:24     Assessment and Plan:   Neck and chest discomfort, some pleuritic qualities, normal troponin and EKG.  Not think this is ischemic.            elevation of D-dimer (sed rate pending) suggests possible inflammatory mechanism i.e. viral etc. 2 chronic essential hypertension on multiple medications and somewhat difficult to control 3.  Chronic stable renal insufficiency stage III 4.  Anemia with thalassemia minor 5.  Coronary calcification (scattered as noted on CT angio).  Thus has some degree of coronary atherosclerosis 6.  History of slight elevation of cholesterol with LDLs approximately 120 not at guideline with  atherosclerosis  Recommendation: I ordered a  lipid panel and sed rate.  I am suspecting pleural/pericarditic type symptoms from possible recent viral illness.  Because of his renal insufficiency he cannot take nonsteroidal drugs  but cannot take aspirin.  Assuming his second troponin is normal I would discharge him and recommend that he have an outpatient perfusion stress test just to make sure there is nothing significant going on.  Because of the mild coronary calcification I will treat him as if he had atherosclerosis and bring his LDL cholesterol down with statins. Would like to avoid cardiac cath because of his baseline renal insufficiency.  His normal physicians.  Negative for having [place] see him for you   For questions or updates, please contact [place] Please consult [email or web address] for contact info under    Signed, Abel Presto, MD  01/09/2021 10:25 AM

## 2021-01-09 NOTE — ED Triage Notes (Signed)
Patient reports mid-sternal chest pain that started yesterday. States that he has had some upper back pain that started the day before. Denies any other symptoms. States that movement makes the pain worse as well as deep inspiration. Denies radiation.

## 2021-01-09 NOTE — ED Notes (Signed)
ED Provider at bedside. 

## 2021-01-09 NOTE — ED Notes (Signed)
Patient refused second dose of Nitro due to headache. Tylenol was given. Dr. Roderic Palau made aware.

## 2021-01-09 NOTE — Telephone Encounter (Signed)
Order placed

## 2021-01-09 NOTE — Telephone Encounter (Signed)
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 sooner than later? thanks. he's in ED"

## 2021-01-09 NOTE — ED Provider Notes (Signed)
[facility] EMERGENCY DEPARTMENT Provider Note   CSN: [id number] Arrival date & time: 01/09/21  [id number]     History Chief Complaint  Patient presents with   Chest Pain    Devin Wilson is a 55 y.o. male.  Patient complains of chest discomfort off and on for 2 days.  Not really pleuritic.  Patient did not get shortness of breath or sweating.  Patient has a history of significant hypertension and family history of thoracic aneurysm  The history is provided by the patient and medical records. No language interpreter was used.  Chest Pain Pain location:  Substernal area Pain quality: aching   Pain radiates to:  Does not radiate Pain severity:  Moderate Onset quality:  Sudden Duration:  2 days Timing:  Constant Progression:  Worsening Chronicity:  New Relieved by:  Nothing Worsened by:  Nothing Ineffective treatments:  None tried Associated symptoms: no abdominal pain, no back pain, no cough, no fatigue and no headache       Past Medical History:  Diagnosis Date   Angina    Bradycardia, sinus    Chest discomfort    Chest pain    a. 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    Primary osteoarthritis of left knee    Stage 2 chronic kidney disease 01/13/2018   HSV-2 (herpes simplex virus 2) infection 12/20/2017   Medial meniscus, posterior horn derangement 02/20/2012   GERD (gastroesophageal reflux disease)     Chest pain on exertion 08/10/2011   Primary hypertension 08/10/2011   Unstable angina ([place]) 08/10/2011   Thalassemia minor    SPRAIN AND STRAIN OF METACARPOPHALANGEAL OF HAND 08/16/2009    Past Surgical History:  Procedure Laterality Date   CARDIAC CATHETERIZATION  08/10/11   COLONOSCOPY  2006   Dr.Rourk @ [facility]   HERNIA REPAIR     "in infancy; umbilical"   KNEE ARTHROSCOPY WITH MEDIAL MENISECTOMY Left 03/24/2018   Procedure: KNEE ARTHROSCOPY WITH MEDIAL MENISECTOMY,LATERAL MENISECTOMY, CHONDROPLASTY MEDIAL FEMUR;  Surgeon: [name], MD;  Location: [facility];  Service: Orthopedics;  Laterality: Left;   LEFT HEART CATHETERIZATION WITH CORONARY ANGIOGRAM N/A 08/10/2011   Procedure: LEFT HEART CATHETERIZATION WITH CORONARY ANGIOGRAM;  Surgeon: [name], MD;  Location: [facility] CATH LAB;  Service: Cardiovascular;  Laterality: N/A;   VASECTOMY     WISDOM TOOTH EXTRACTION         Family History  Problem Relation Age of Onset   Aneurysm Father    Colon polyps Neg Hx    Colon cancer Neg Hx    Esophageal cancer Neg Hx    Rectal cancer Neg Hx    Stomach cancer Neg Hx     Social History   Tobacco Use   Smoking status: Never   Smokeless  tobacco: Never  Vaping Use   Vaping Use: Never used  Substance Use Topics   Alcohol use: Yes    Alcohol/week: 6.0 standard drinks    Types: 1 Glasses of wine, 5 Standard drinks or equivalent per week   Drug use: No    Home Medications Prior to Admission medications   Medication Sig Start Date End Date Taking? Authorizing Provider  pantoprazole (PROTONIX) 20 MG tablet Take 1 tablet (20 mg total) by mouth daily. 01/09/21  Yes Milton Ferguson, MD  acyclovir (ZOVIRAX) 800 MG tablet TAKE 1 TABLET EVERY DAY 02/02/20   Jerene Dilling, PA  amLODipine (NORVASC) 10 MG tablet Take 10 mg by mouth at bedtime.  10/31/17   [provider]  Cholecalciferol (VITAMIN D-3) 125 MCG (5000 UT) TABS Take by mouth daily.    [provider]   cloNIDine (CATAPRES) 0.1 MG tablet Take 0.1 mg by mouth at bedtime.     [provider]  Coenzyme Q10 (COQ10 PO) Take 1 capsule by mouth daily.    [provider]  Docusate Sodium 100 MG capsule Take 100 mg by mouth at bedtime. Takes 2 tablets at bedtime.    [provider]  EDARBI 80 MG TABS Take 1 tablet by mouth daily. 02/03/20   [provider]  labetalol (NORMODYNE) 300 MG tablet Take 900 mg by mouth 2 (two) times daily. 10/31/17   [provider]  Multiple Vitamin (MULTIVITAMIN WITH MINERALS) TABS tablet Take 1 tablet by mouth daily.    [provider]  Nutritional Supplements (DHEA PO) Take 30 mg by mouth as needed.     [provider]  Omega-3 Fatty Acids (OMEGA 3 500 PO) Take by mouth daily.    [provider]  Potassium Chloride ER 20 MEQ TBCR Take 2 tablets by mouth 2 (two) times daily. 01/12/20   [provider]  tadalafil (CIALIS) 5 MG tablet Take 5 mg by mouth as needed.     [provider]  triamterene-hydrochlorothiazide (MAXZIDE-25) 37.5-25 MG per tablet Take 1 tablet by mouth daily. Patient not taking: No sig reported    [provider]    Allergies    Ciprofloxacin and Levaquin [levofloxacin in d5w]  Review of Systems   Review of Systems  Constitutional:  Negative for appetite change and fatigue.  HENT:  Negative for congestion, ear discharge and sinus pressure.   Eyes:  Negative for discharge.  Respiratory:  Negative for cough.   Cardiovascular:  Positive for chest pain.  Gastrointestinal:  Negative for abdominal pain and diarrhea.  Genitourinary:  Negative for frequency and hematuria.  Musculoskeletal:  Negative for back pain.  Skin:  Negative for rash.  Neurological:  Negative for seizures and headaches.  Psychiatric/Behavioral:  Negative for hallucinations.    Physical Exam Updated Vital Signs BP 133/85   Pulse (!) 48   Temp 97.7 F (36.5 C) (Oral)   Resp 18    Ht '5\' 9"'$  (1.753 m)   Wt 81.6 kg   SpO2 99%   BMI 26.57 kg/m   Physical Exam Vitals and nursing note reviewed.  Constitutional:      Appearance: He is well-developed.  HENT:     Head: Normocephalic.     Nose: Nose normal.  Eyes:     General: No scleral icterus.    Conjunctiva/sclera: Conjunctivae normal.  Neck:     Thyroid: No thyromegaly.  Cardiovascular:     Rate and Rhythm: Normal rate and regular rhythm.  Heart sounds: No murmur heard.   No friction rub. No gallop.  Pulmonary:     Breath sounds: No stridor. No wheezing or rales.  Chest:     Chest wall: No tenderness.  Abdominal:     General: There is no distension.     Tenderness: There is no abdominal tenderness. There is no rebound.  Musculoskeletal:        General: Normal range of motion.     Cervical back: Neck supple.  Lymphadenopathy:     Cervical: No cervical adenopathy.  Skin:    Findings: No erythema or rash.  Neurological:     Mental Status: He is alert and oriented to person, place, and time.     Motor: No abnormal muscle tone.     Coordination: Coordination normal.  Psychiatric:        Behavior: Behavior normal.    ED Results / Procedures / Treatments   Labs (all labs ordered are listed, but only abnormal results are displayed) Labs Reviewed  CBC WITH DIFFERENTIAL/PLATELET - Abnormal; Notable for the following components:      Result Value   Hemoglobin 10.0 (*)    HCT 33.0 (*)    MCV 77.8 (*)    MCH 23.6 (*)    RDW 15.6 (*)    All other components within normal limits  COMPREHENSIVE METABOLIC PANEL - Abnormal; Notable for the following components:   Potassium 3.4 (*)    Creatinine, Ser 1.63 (*)    Alkaline Phosphatase 139 (*)    GFR, Estimated 49 (*)    All other components within normal limits  D-DIMER, QUANTITATIVE - Abnormal; Notable for the following components:   D-Dimer, Quant 1.24 (*)    All other components within normal limits  LIPID PANEL - Abnormal; Notable for the  following components:   HDL 31 (*)    LDL Cholesterol 101 (*)    All other components within normal limits  I-STAT CHEM 8, ED - Abnormal; Notable for the following components:   Potassium 3.4 (*)    Creatinine, Ser 1.70 (*)    Calcium, Ion 1.08 (*)    Hemoglobin 10.5 (*)    HCT 31.0 (*)    All other components within normal limits  RESP PANEL BY RT-PCR (FLU A&B, COVID) ARPGX2  LIPASE, BLOOD  H. PYLORI ANTIBODY, IGG  SEDIMENTATION RATE  TROPONIN I (HIGH SENSITIVITY)  TROPONIN I (HIGH SENSITIVITY)    EKG EKG Interpretation  Date/Time:  Monday January 09 2021 08:02:56 EDT Ventricular Rate:  68 PR Interval:  189 QRS Duration: 111 QT Interval:  396 QTC Calculation: 422 R Axis:   65 Text Interpretation: Sinus rhythm RSR' in V1 or V2, probably normal variant Minimal ST elevation, anterior leads Confirmed by Milton Ferguson [phone]) on 01/09/2021 8:10:56 AM  Radiology DG Chest Port 1 View  Result Date: 01/09/2021 CLINICAL DATA:  Pain EXAM: PORTABLE CHEST 1 VIEW COMPARISON:  Chest radiograph 03/20/2018 FINDINGS: The heart is mildly enlarged, not significantly changed. The mediastinal contours are otherwise within normal limits. The lungs are clear, with no focal consolidation or pulmonary edema. There may be a trace right pleural effusion. There is no significant left effusion. There is no pneumothorax. The bones are unremarkable. IMPRESSION: 1. Possible trace right pleural effusion. 2. Cardiomegaly, not significantly changed since 2019. 3. Otherwise no radiographic evidence of acute cardiopulmonary process. Electronically Signed   By: Valetta Mole M.D.   On: 01/09/2021 08:32   CT Angio Chest/Abd/Pel for Dissection W and/or  Wo Contrast  Addendum Date: 01/09/2021   ADDENDUM REPORT: 01/09/2021 09:56 ADDENDUM: Discussed with Dr. Roderic Palau. Specifically, no gross evidence of pulmonary embolism. Electronically Signed   By: Lavonia Dana M.D.   On: 01/09/2021 09:56   Result Date: 01/09/2021 CLINICAL  DATA:  Anterior chest pain 24 hours duration, chest or back pain aortic dissection suspected, history GERD, hypertension, mild chronic kidney disease EXAM: CT ANGIOGRAPHY CHEST, ABDOMEN AND PELVIS TECHNIQUE: Non-contrast CT of the chest was initially obtained. Multidetector CT imaging through the chest, abdomen and pelvis was performed using the standard protocol during bolus administration of intravenous contrast. Multiplanar reconstructed images and MIPs were obtained and reviewed to evaluate the vascular anatomy. CONTRAST:  61m OMNIPAQUE IOHEXOL 350 MG/ML SOLN IV COMPARISON:  CT abdomen and pelvis 03/14/2018, CT angio chest 08/10/2011 FINDINGS: CTA CHEST FINDINGS Cardiovascular: No intramural hematoma on precontrast imaging. Scattered atherosclerotic calcifications in coronary arteries. Aorta normal caliber. No evidence of aneurysm or dissection on postcontrast imaging. Thoracic vascular structures patent. Mediastinum/Nodes: Base of cervical region normal appearance. No thoracic adenopathy. Esophagus unremarkable. Lungs/Pleura: Small BILATERAL pleural effusions. Minimal compressive atelectasis of the posterior lower lobes greater on LEFT. No pulmonary infiltrate or pneumothorax. Tiny subpleural nodular density RIGHT upper lobe 3 mm diameter image 56 nonspecific but new since 2013. No additional pulmonary nodules. Musculoskeletal: Normal appearance Review of the MIP images confirms the above findings. CTA ABDOMEN AND PELVIS FINDINGS VASCULAR Aorta: Normal caliber without aneurysm or dissection. Celiac: Widely patent SMA: Widely patent Renals: Mildly patent IMA: Patent Inflow: Atherosclerotic calcifications at the common iliac arteries bilaterally. Patent. Veins: Suboptimally opacified at timing of CT a imaging Review of the MIP images confirms the above findings. NON-VASCULAR Hepatobiliary: Gallbladder and liver normal appearance Pancreas: Normal appearance Spleen: Normal appearance Adrenals/Urinary Tract:  Adrenal glands normal appearance. Cyst at upper pole LEFT kidney posteriorly 2.4 x 2.0 cm image 94. Kidneys, ureters, and bladder otherwise normal appearance Stomach/Bowel: Appendix questionably visualized, unremarkable. Stool throughout colon. Stomach decompressed. Bowel loops otherwise unremarkable for exam lacking GI contrast and adequate distension. Lymphatic: No adenopathy Reproductive: Unremarkable. Other: No free air or free fluid.  Umbilical hernia containing fat Musculoskeletal: Unremarkable Review of the MIP images confirms the above findings. IMPRESSION: No evidence of aortic aneurysm or dissection. Scattered atherosclerotic calcifications including coronary arteries. Small BILATERAL pleural effusions with minimal compressive atelectasis of the posterior lower lobes greater on LEFT. No acute intra-abdominal or intrapelvic abnormalities. Umbilical hernia containing fat. Tiny subpleural nodular density RIGHT upper lobe 3 mm diameter nonspecific but new since 2013; no follow-up needed if patient is low-risk. Non-contrast chest CT can be considered in 12 months if patient is high-risk. This recommendation follows the consensus statement: Guidelines for Management of Incidental Pulmonary Nodules Detected on CT Images: From the Fleischner Society 2017; Radiology 2017; 284:228-243. Electronically Signed: By: MLavonia DanaM.D. On: 01/09/2021 09:24    Procedures Procedures   Medications Ordered in ED Medications  nitroGLYCERIN (NITROSTAT) SL tablet 0.4 mg (0.4 mg Sublingual Given 01/09/21 0815)  pantoprazole (PROTONIX) injection 40 mg (40 mg Intravenous Given 01/09/21 0816)  acetaminophen (TYLENOL) tablet 650 mg (650 mg Oral Given 01/09/21 0836)  iohexol (OMNIPAQUE) 350 MG/ML injection 80 mL (80 mLs Intravenous Contrast Given 01/09/21 0852)  alum & mag hydroxide-simeth (MAALOX/MYLANTA) 200-200-20 MG/5ML suspension 30 mL (30 mLs Oral Given 01/09/21 1046)    And  lidocaine (XYLOCAINE) 2 % viscous mouth  solution 15 mL (15 mLs Oral Given 01/09/21 1046)    ED Course  I have reviewed  the triage vital signs and the nursing notes.  Pertinent labs & imaging results that were available during my care of the patient were reviewed by me and considered in my medical decision making (see chart for details).  Patient with central chest discomfort not typical for coronary disease.  EKG shows minimal ST changes that are unchanged from previous EKGs.  I spoke with cardiology who reviewed the EKG and they agree.  Patient was also seen by cardiology and they felt like the patient could be discharged home with an outpatient stress test.  Troponins have been normal.  Patient will be placed on Protonix for possible GERD symptoms MDM Rules/Calculators/A&P                           Patient with atypical chest discomfort.  Most likely GI related.  He will follow-up with cardiology for stress test and is started on protonic Final Clinical Impression(s) / ED Diagnoses Final diagnoses:  Atypical chest pain    Rx / DC Orders ED Discharge Orders          Ordered    pantoprazole (PROTONIX) 20 MG tablet  Daily        01/09/21 1122             Milton Ferguson, MD 01/09/21 1129

## 2021-01-10 LAB — H. PYLORI ANTIBODY, IGG: H Pylori IgG: 0.26 Index Value (ref 0.00–0.79)

## 2021-01-11 ENCOUNTER — Other Ambulatory Visit: Payer: Self-pay | Admitting: Urology

## 2021-01-19 ENCOUNTER — Ambulatory Visit (HOSPITAL_COMMUNITY): Admit: 2021-01-19 | Payer: 59

## 2021-01-19 ENCOUNTER — Encounter (HOSPITAL_COMMUNITY): Payer: Self-pay

## 2021-01-19 ENCOUNTER — Ambulatory Visit (HOSPITAL_COMMUNITY)
Admission: RE | Admit: 2021-01-19 | Discharge: 2021-01-19 | Disposition: A | Discharge status: Home / Self Care | Payer: 59 | Source: Ambulatory Visit | Attending: Physician Assistant | Admitting: Physician Assistant

## 2021-01-19 ENCOUNTER — Other Ambulatory Visit: Payer: Self-pay

## 2021-01-19 DIAGNOSIS — R079 Chest pain, unspecified: Secondary | ICD-10-CM | POA: Insufficient documentation

## 2021-01-26 ENCOUNTER — Encounter (HOSPITAL_COMMUNITY): Payer: Self-pay

## 2021-01-26 ENCOUNTER — Ambulatory Visit (HOSPITAL_COMMUNITY)
Admission: RE | Admit: 2021-01-26 | Discharge: 2021-01-26 | Disposition: A | Discharge status: Home / Self Care | Payer: 59 | Source: Ambulatory Visit | Attending: Physician Assistant | Admitting: Physician Assistant

## 2021-01-26 ENCOUNTER — Encounter (HOSPITAL_COMMUNITY)
Admission: RE | Admit: 2021-01-26 | Discharge: 2021-01-26 | Disposition: A | Discharge status: Home / Self Care | Payer: 59 | Source: Ambulatory Visit | Attending: Physician Assistant | Admitting: Physician Assistant

## 2021-01-26 ENCOUNTER — Other Ambulatory Visit: Payer: Self-pay

## 2021-01-26 DIAGNOSIS — R079 Chest pain, unspecified: Secondary | ICD-10-CM | POA: Diagnosis present

## 2021-01-26 DIAGNOSIS — M549 Dorsalgia, unspecified: Secondary | ICD-10-CM | POA: Diagnosis not present

## 2021-01-26 HISTORY — DX: Disorder of kidney and ureter, unspecified: N28.9

## 2021-01-26 LAB — NM MYOCAR MULTI W/SPECT W/WALL MOTION / EF
Angina Index: 0
Duke Treadmill Score: 7
Estimated workload: 8.3
Exercise duration (min): 6 min
Exercise duration (sec): 38 s
LV dias vol: 138 mL (ref 62–150)
LV sys vol: 60 mL
MPHR: 165 {beats}/min
Nuc Stress EF: 57 %
Peak HR: 150 {beats}/min
Percent HR: 90 %
RATE: 0.3
RPE: 15
Rest HR: 68 {beats}/min
Rest Nuclear Isotope Dose: 10.3 mCi
SDS: 0
SRS: 0
SSS: 0
ST Depression (mm): 0 mm
Stress Nuclear Isotope Dose: 32 mCi
TID: 1.06

## 2021-01-26 MED ORDER — TECHNETIUM TC 99M TETROFOSMIN IV KIT
30.0000 | PACK | Freq: Once | INTRAVENOUS | Status: AC | PRN
  Administered 2021-01-26: 32 via INTRAVENOUS

## 2021-01-26 MED ORDER — SODIUM CHLORIDE FLUSH 0.9 % IV SOLN
INTRAVENOUS | Status: AC
  Administered 2021-01-26: 10 mL via INTRAVENOUS
  Filled 2021-01-26: qty 10

## 2021-01-26 MED ORDER — TECHNETIUM TC 99M TETROFOSMIN IV KIT
10.0000 | PACK | Freq: Once | INTRAVENOUS | Status: AC | PRN
  Administered 2021-01-26: 10.3 via INTRAVENOUS

## 2021-01-26 MED ORDER — REGADENOSON 0.4 MG/5ML IV SOLN
INTRAVENOUS | Status: AC
  Filled 2021-01-26: qty 5

## 2021-01-27 ENCOUNTER — Encounter (HOSPITAL_COMMUNITY): Payer: 59

## 2021-02-01 ENCOUNTER — Other Ambulatory Visit (INDEPENDENT_AMBULATORY_CARE_PROVIDER_SITE_OTHER): Payer: Self-pay

## 2021-02-01 DIAGNOSIS — D582 Other hemoglobinopathies: Secondary | ICD-10-CM

## 2021-02-01 DIAGNOSIS — R1084 Generalized abdominal pain: Secondary | ICD-10-CM

## 2021-02-01 DIAGNOSIS — K219 Gastro-esophageal reflux disease without esophagitis: Secondary | ICD-10-CM

## 2021-02-02 ENCOUNTER — Other Ambulatory Visit: Payer: Self-pay

## 2021-02-02 ENCOUNTER — Encounter (HOSPITAL_COMMUNITY)
Admission: RE | Admit: 2021-02-02 | Discharge: 2021-02-02 | Disposition: A | Discharge status: Home / Self Care | Payer: 59 | Source: Ambulatory Visit | Attending: Internal Medicine | Admitting: Internal Medicine

## 2021-02-02 ENCOUNTER — Ambulatory Visit (HOSPITAL_COMMUNITY): Payer: 59 | Admitting: Anesthesiology

## 2021-02-02 ENCOUNTER — Ambulatory Visit (HOSPITAL_COMMUNITY)
Admission: RE | Admit: 2021-02-02 | Discharge: 2021-02-02 | Disposition: A | Discharge status: Home / Self Care | Payer: 59 | Attending: Internal Medicine | Admitting: Internal Medicine

## 2021-02-02 ENCOUNTER — Telehealth (INDEPENDENT_AMBULATORY_CARE_PROVIDER_SITE_OTHER): Payer: Self-pay

## 2021-02-02 ENCOUNTER — Encounter (HOSPITAL_COMMUNITY)
Admission: RE | Disposition: A | Discharge status: Home / Self Care | Payer: Self-pay | Source: Home / Self Care | Attending: Internal Medicine

## 2021-02-02 ENCOUNTER — Encounter (HOSPITAL_COMMUNITY): Payer: Self-pay | Admitting: Internal Medicine

## 2021-02-02 DIAGNOSIS — K319 Disease of stomach and duodenum, unspecified: Secondary | ICD-10-CM | POA: Diagnosis not present

## 2021-02-02 DIAGNOSIS — R0789 Other chest pain: Secondary | ICD-10-CM | POA: Insufficient documentation

## 2021-02-02 DIAGNOSIS — I129 Hypertensive chronic kidney disease with stage 1 through stage 4 chronic kidney disease, or unspecified chronic kidney disease: Secondary | ICD-10-CM | POA: Diagnosis not present

## 2021-02-02 DIAGNOSIS — Z79899 Other long term (current) drug therapy: Secondary | ICD-10-CM | POA: Diagnosis not present

## 2021-02-02 DIAGNOSIS — Z8249 Family history of ischemic heart disease and other diseases of the circulatory system: Secondary | ICD-10-CM | POA: Diagnosis not present

## 2021-02-02 DIAGNOSIS — K219 Gastro-esophageal reflux disease without esophagitis: Secondary | ICD-10-CM

## 2021-02-02 DIAGNOSIS — N183 Chronic kidney disease, stage 3 unspecified: Secondary | ICD-10-CM | POA: Diagnosis not present

## 2021-02-02 DIAGNOSIS — K3189 Other diseases of stomach and duodenum: Secondary | ICD-10-CM | POA: Diagnosis not present

## 2021-02-02 DIAGNOSIS — Z881 Allergy status to other antibiotic agents status: Secondary | ICD-10-CM | POA: Insufficient documentation

## 2021-02-02 DIAGNOSIS — R1013 Epigastric pain: Secondary | ICD-10-CM | POA: Insufficient documentation

## 2021-02-02 DIAGNOSIS — D582 Other hemoglobinopathies: Secondary | ICD-10-CM

## 2021-02-02 DIAGNOSIS — R1084 Generalized abdominal pain: Secondary | ICD-10-CM

## 2021-02-02 DIAGNOSIS — R079 Chest pain, unspecified: Secondary | ICD-10-CM | POA: Diagnosis not present

## 2021-02-02 HISTORY — PX: BIOPSY: SHX5522

## 2021-02-02 HISTORY — PX: ESOPHAGOGASTRODUODENOSCOPY (EGD) WITH PROPOFOL: SHX5813

## 2021-02-02 SURGERY — ESOPHAGOGASTRODUODENOSCOPY (EGD) WITH PROPOFOL
Anesthesia: General

## 2021-02-02 MED ORDER — LIDOCAINE HCL (CARDIAC) PF 100 MG/5ML IV SOSY
PREFILLED_SYRINGE | INTRAVENOUS | Status: DC | PRN
  Administered 2021-02-02: 50 mg via INTRAVENOUS

## 2021-02-02 MED ORDER — STERILE WATER FOR IRRIGATION IR SOLN
Status: DC | PRN
  Administered 2021-02-02: 100 mL

## 2021-02-02 MED ORDER — LACTATED RINGERS IV SOLN: INTRAVENOUS | Status: DC | PRN

## 2021-02-02 MED ORDER — LACTATED RINGERS IV SOLN: INTRAVENOUS | Status: DC

## 2021-02-02 MED ORDER — PROPOFOL 500 MG/50ML IV EMUL
INTRAVENOUS | Status: DC | PRN
  Administered 2021-02-02: 150 ug/kg/min via INTRAVENOUS

## 2021-02-02 MED ORDER — PROPOFOL 10 MG/ML IV BOLUS
INTRAVENOUS | Status: DC | PRN
  Administered 2021-02-02: 30 mg via INTRAVENOUS
  Administered 2021-02-02: 100 mg via INTRAVENOUS

## 2021-02-02 NOTE — H&P (Signed)
Devin Wilson is an 55 y.o. male.   Chief Complaint: Patient is here for esophagogastroduodenoscopy. 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  He says his chest pain started about 3 weeks ago.  Because of family history of thoracic aneurysm in his father he was concerned.  He underwent cardiac evaluation and cardiopulmonary etiology was ruled out.  Now he is also experienced pain in epigastric region.  He rarely experiences heartburn.  He does not take any NSAIDs.  He is not having any dysphagia.  However when he takes Tums it provides temporary relief.  He also gets relief when he belches.  He has not experienced nausea vomiting melena or rectal bleeding.  His appetite is normal and he has not lost any weight.  He does not take any OTC NSAIDs. He was recently begun on pantoprazole at a dose of 20 mg daily but he cannot tell any difference. He says he has difficulty treat hypertension.  He is also on diuretic and having to take high-dose potassium.  He has not had any problems swallowing potassium pills. No history of peptic ulcer disease.  Recent H. pylori serology was negative. He underwent screening colonoscopy by Dr. Stefani Dama Roddy in December 2021 with removal of 2 small polyps and these are hyperplastic.  He was also noted to have hemorrhoids. He has history of thalassemia minor and chronic anemia but his hemoglobin has dropped by more than 1 g recently. Recent LFTs normal except borderline alkaline phosphatase probably from a bony source given his chronic kidney disease. He does not smoke cigarettes.  He may have 3 drinks of alcohol per week. Family history significant for thoracic in transverse him and his father who died while he was in his late 100s.  Mother is alive and she has hypertension.  He has 2 brothers who do not have hypertension but all 5 sisters have hypertension.   Past Medical History:  Diagnosis Date   Angina     Bradycardia, sinus    Chest discomfort    Chest pain    a. 3.15.2013 Cath: Minor Irregs   Chronic kidney disease    mild kidney failure stage 2-3   Dyslipidemia           Hyperlipidemia    on meds   Hypertension    on meds   Migraines    Renal insufficiency    Sleep apnea    auto set-CPAP   Thalassemia minor    hx of   Thalassemia minor     Past Surgical History:  Procedure Laterality Date   CARDIAC CATHETERIZATION  08/10/11   COLONOSCOPY  2006   Dr.Rourk @ [facility]   HERNIA REPAIR     "in infancy; umbilical"   KNEE ARTHROSCOPY WITH MEDIAL MENISECTOMY Left 03/24/2018   Procedure: KNEE ARTHROSCOPY WITH MEDIAL MENISECTOMY,LATERAL MENISECTOMY, CHONDROPLASTY MEDIAL FEMUR;  Surgeon: [name], MD;  Location: [facility];  Service: Orthopedics;  Laterality: Left;   LEFT HEART CATHETERIZATION WITH CORONARY ANGIOGRAM N/A 08/10/2011   Procedure: LEFT HEART CATHETERIZATION WITH CORONARY ANGIOGRAM;  Surgeon: [name], MD;  Location: [facility] CATH LAB;  Service: Cardiovascular;  Laterality: N/A;   VASECTOMY     WISDOM TOOTH EXTRACTION      Family History  Problem Relation Age of Onset   Aneurysm Father    Colon polyps Neg Hx    Colon cancer Neg Hx    Esophageal cancer Neg Hx    Rectal cancer  Neg Hx    Stomach cancer Neg Hx    Social History:  reports that he has never smoked. He has never used smokeless tobacco. He reports current alcohol use of about 6.0 standard drinks per week. He reports that he does not use drugs.  Allergies:  Allergies  Allergen Reactions   Ciprofloxacin Swelling    All flouroquinolones/Facial swelling   Levaquin [Levofloxacin In D5w]     Medications Prior to Admission  Medication Sig Dispense Refill   amLODipine (NORVASC) 10 MG tablet Take 10 mg by mouth at bedtime.      cloNIDine (CATAPRES) 0.1 MG tablet Take 0.1 mg by mouth at bedtime.      labetalol (NORMODYNE) 300 MG tablet Take 900 mg by mouth 2 (two) times daily.     acyclovir  (ZOVIRAX) 800 MG tablet TAKE 1 TABLET EVERY DAY 31 tablet 12   Cholecalciferol (VITAMIN D-3) 125 MCG (5000 UT) TABS Take by mouth daily.     Coenzyme Q10 (COQ10 PO) Take 1 capsule by mouth daily.     Docusate Sodium 100 MG capsule Take 100 mg by mouth at bedtime. Takes 2 tablets at bedtime.     EDARBI 80 MG TABS Take 1 tablet by mouth daily.     Multiple Vitamin (MULTIVITAMIN WITH MINERALS) TABS tablet Take 1 tablet by mouth daily.     Nutritional Supplements (DHEA PO) Take 30 mg by mouth as needed.      Omega-3 Fatty Acids (OMEGA 3 500 PO) Take by mouth daily.     pantoprazole (PROTONIX) 20 MG tablet Take 1 tablet (20 mg total) by mouth daily. 30 tablet 2   Potassium Chloride ER 20 MEQ TBCR Take 2 tablets by mouth 2 (two) times daily.     tadalafil (CIALIS) 5 MG tablet Take 5 mg by mouth as needed.      triamterene-hydrochlorothiazide (MAXZIDE-25) 37.5-25 MG per tablet Take 1 tablet by mouth daily. (Patient not taking: No sig reported)      No results found for this or any previous visit (from the past 48 hour(s)). No results found.  Review of Systems  Blood pressure (!) 135/96, pulse 74, temperature 97.8 F (36.6 C), temperature source Oral, resp. rate 14, height '5\' 9"'$  (1.753 m), weight 83.9 kg, SpO2 99 %. Physical Exam HENT:     Mouth/Throat:     Mouth: Mucous membranes are moist.     Pharynx: Oropharynx is clear.  Eyes:     General: No scleral icterus.    Conjunctiva/sclera: Conjunctivae normal.  Cardiovascular:     Rate and Rhythm: Normal rate and regular rhythm.     Heart sounds: Normal heart sounds. No murmur heard. Pulmonary:     Effort: Pulmonary effort is normal.     Breath sounds: Normal breath sounds.  Abdominal:     Comments: Abdomen is symmetrical.  Supraumbilical scar noted.  On palpation abdomen is soft with mild midepigastric tenderness.  No organomegaly or masses.  Musculoskeletal:        General: No swelling.     Cervical back: Neck supple.  Lymphadenopathy:      Cervical: No cervical adenopathy.  Skin:    General: Skin is warm and dry.  Neurological:     Mental Status: He is alert.     Assessment/Plan  Atypical chest pain.  Cardiopulmonary evaluation negative. Epigastric pain not responding to PPI. Diagnostic esophagogastroduodenoscopy.  Hildred Laser, MD 02/02/2021, 10:56 AM

## 2021-02-02 NOTE — Telephone Encounter (Addendum)
Dr [name] was scheduled yesterday at 3:30  in Room 3 (OR) with Dr Laural Golden patient was booked on the schedule and order was put in and no Pre Op Appointment was ever made as of 8:00 am this morning, I was told by Pamala Hurry OR nurse someone would be calling Dr [name] to do pre op over the phone and to let the patient know time of arrival  this morning. I called Dr [name] and informed him that someone from the OR would be calling with time of arrival

## 2021-02-02 NOTE — Anesthesia Postprocedure Evaluation (Signed)
Anesthesia Post Note  Patient: Devin Wilson  Procedure(s) Performed: ESOPHAGOGASTRODUODENOSCOPY (EGD) WITH PROPOFOL BIOPSY  Patient location during evaluation: Phase II Anesthesia Type: General Level of consciousness: awake Pain management: pain level controlled Vital Signs Assessment: post-procedure vital signs reviewed and stable Respiratory status: spontaneous breathing and respiratory function stable Cardiovascular status: blood pressure returned to baseline and stable Postop Assessment: no headache and no apparent nausea or vomiting Anesthetic complications: no Comments: Late entry   No notable events documented.   Last Vitals:  Vitals:   02/02/21 0933 02/02/21 1130  BP: (!) 135/96 113/67  Pulse: 74 71  Resp: 14 18  Temp: 36.6 C 36.6 C  SpO2: 99% 96%    Last Pain:  Vitals:   02/02/21 1130  TempSrc: Oral  PainSc: 0-No pain                 Louann Sjogren

## 2021-02-02 NOTE — Telephone Encounter (Signed)
Thank you for your help.

## 2021-02-02 NOTE — Anesthesia Preprocedure Evaluation (Addendum)
Anesthesia Evaluation  Patient identified by MRN, date of birth, ID band Patient awake    Reviewed: Allergy & Precautions, H&P , NPO status , Patient's Chart, lab work & pertinent test results, reviewed documented beta blocker date and time   Airway Mallampati: II  TM Distance: >3 FB Neck ROM: full    Dental no notable dental hx.    Pulmonary sleep apnea ,    Pulmonary exam normal breath sounds clear to auscultation       Cardiovascular Exercise Tolerance: Good hypertension, negative cardio ROS   Rhythm:regular Rate:Normal     Neuro/Psych  Headaches, negative psych ROS   GI/Hepatic Neg liver ROS, GERD  Medicated,  Endo/Other  negative endocrine ROS  Renal/GU CRFRenal disease  negative genitourinary   Musculoskeletal  (+) Arthritis ,   Abdominal   Peds  Hematology  (+) Blood dyscrasia, anemia ,   Anesthesia Other Findings Stress Test 01/26/21  The study is normal. The study is low risk. Occasional PVCs with exercise. Marland Kitchen  No ST deviation was noted. .  There is a small mild intensity fixed inferior defect with normal wall motion. Finding is consistent with artifact secondary to subdiaphragmatic attenuation. .  Left ventricular function is normal(57%). End diastolic cavity size is mildly enlarged.  Reproductive/Obstetrics negative OB ROS                            Anesthesia Physical Anesthesia Plan  ASA: 2  Anesthesia Plan: General   Post-op Pain Management:    Induction:   PONV Risk Score and Plan: Propofol infusion  Airway Management Planned:   Additional Equipment:   Intra-op Plan:   Post-operative Plan:   Informed Consent: I have reviewed the patients History and Physical, chart, labs and discussed the procedure including the risks, benefits and alternatives for the proposed anesthesia with the patient or authorized representative who has indicated his/her understanding and  acceptance.     Dental Advisory Given  Plan Discussed with: CRNA  Anesthesia Plan Comments:         Anesthesia Quick Evaluation

## 2021-02-02 NOTE — Discharge Instructions (Signed)
Resume usual medications and diet as before. No driving for 24 hours. Physician will call with biopsy results and further recommendations. 

## 2021-02-02 NOTE — Transfer of Care (Signed)
Immediate Anesthesia Transfer of Care Note  Patient: Devin Wilson  Procedure(s) Performed: ESOPHAGOGASTRODUODENOSCOPY (EGD) WITH PROPOFOL BIOPSY  Patient Location: Short Stay  Anesthesia Type:General  Level of Consciousness: awake, alert  and oriented  Airway & Oxygen Therapy: Patient Spontanous Breathing  Post-op Assessment: Report given to RN and Post -op Vital signs reviewed and stable  Post vital signs: Reviewed and stable  Last Vitals:  Vitals Value Taken Time  BP 108/75 02/02/21  1130  Temp    Pulse 71 02/02/21  1130  Resp 20 02/02/21 1130  SpO2 96 02/02/21  1130    Last Pain:  Vitals:   02/02/21 1110  TempSrc:   PainSc: 0-No pain      Patients Stated Pain Goal: 8 ([id number])  Complications: No notable events documented.

## 2021-02-02 NOTE — Op Note (Signed)
[facility] Patient Name: Devin Wilson Procedure Date: 02/02/2021 10:52 AM MRN: [id number] Date of Birth: [birth year] Attending MD: Hildred Laser , MD CSN: [id number] Age: 55 Admit Type: Emergency Department Procedure:                Upper GI endoscopy Indications:              Epigastric abdominal pain, Unexplained chest pain Providers:                Hildred Laser, MD, Caprice Kluver, Aram Candela Referring MD:             Redmond School, MD Medicines:                Propofol per Anesthesia Complications:            No immediate complications. Estimated Blood Loss:     Estimated blood loss was minimal. Procedure:                Pre-Anesthesia Assessment:                           - Prior to the procedure, a History and Physical                            was performed, and patient medications and                            allergies were reviewed. The patient's tolerance of                            previous anesthesia was also reviewed. The risks                            and benefits of the procedure and the sedation                            options and risks were discussed with the patient.                            All questions were answered, and informed consent                            was obtained. Prior Anticoagulants: The patient has                            taken no previous anticoagulant or antiplatelet                            agents. ASA Grade Assessment: II - A patient with                            mild systemic disease. After reviewing the risks                            and benefits, the patient was deemed in  satisfactory condition to undergo the procedure.                           After obtaining informed consent, the endoscope was                            passed under direct vision. Throughout the                            procedure, the patient's blood pressure, pulse, and                            oxygen saturations  were monitored continuously. The                            [id number] scope was introduced through the                            mouth, and advanced to the second part of duodenum.                            The upper GI endoscopy was accomplished without                            difficulty. The patient tolerated the procedure                            well. Scope In: 11:13:51 AM Scope Out: 11:20:30 AM Total Procedure Duration: 0 hours 6 minutes 39 seconds  Findings:      The hypopharynx was normal.      short spastic segment at distal esophagus 2 cm proximal to GEJ.      The exam of the esophagus was otherwise normal.      The Z-line was regular and was found 40 cm from the incisors.      A few erosions with no stigmata of recent bleeding were found in the       prepyloric region of the stomach. Biopsies were taken with a cold       forceps for histology.      The exam of the stomach was otherwise normal.      The duodenal bulb and second portion of the duodenum were normal. Impression:               - Normal hypopharynx.                           - Short spastic segment at distal esophagus 2 cm                            proximal to GEJ otherwise normal esophageal mucosa.                           - Z-line regular, 40 cm from the incisors.                           -  Erosive gastropathy with no stigmata of recent                            bleeding. Biopsied.                           - Normal duodenal bulb and second portion of the                            duodenum. Moderate Sedation:      Per Anesthesia Care Recommendation:           - Patient has a contact number available for                            emergencies. The signs and symptoms of potential                            delayed complications were discussed with the                            patient. Return to normal activities tomorrow.                            Written discharge instructions were  provided to the                            patient.                           - Resume previous diet today.                           - Continue present medications.                           - Await pathology results. Procedure Code(s):        --- Professional ---                           [phone], Esophagogastroduodenoscopy, flexible,                            transoral; with biopsy, single or multiple Diagnosis Code(s):        --- Professional ---                           K31.89, Other diseases of stomach and duodenum                           R10.13, Epigastric pain                           R07.9, Chest pain, unspecified CPT copyright 2019 [employer] Association. All rights reserved. The codes documented in this report are preliminary and upon coder review may  be revised to meet current compliance requirements. Hildred Laser, MD Hildred Laser, MD 02/02/2021 11:36:32 AM This  report has been signed electronically. Number of Addenda: 0

## 2021-02-03 LAB — SURGICAL PATHOLOGY

## 2021-02-04 ENCOUNTER — Other Ambulatory Visit (INDEPENDENT_AMBULATORY_CARE_PROVIDER_SITE_OTHER): Payer: Self-pay | Admitting: Internal Medicine

## 2021-02-04 MED ORDER — PANTOPRAZOLE SODIUM 40 MG PO TBEC: 40.0000 mg | DELAYED_RELEASE_TABLET | Freq: Every day | ORAL | 1 refills | Status: DC

## 2021-02-06 ENCOUNTER — Other Ambulatory Visit (INDEPENDENT_AMBULATORY_CARE_PROVIDER_SITE_OTHER): Payer: Self-pay

## 2021-02-06 DIAGNOSIS — G8929 Other chronic pain: Secondary | ICD-10-CM

## 2021-02-06 DIAGNOSIS — R0789 Other chest pain: Secondary | ICD-10-CM

## 2021-02-06 DIAGNOSIS — R1013 Epigastric pain: Secondary | ICD-10-CM

## 2021-02-07 ENCOUNTER — Emergency Department (HOSPITAL_COMMUNITY): Payer: 59

## 2021-02-07 ENCOUNTER — Encounter (HOSPITAL_COMMUNITY)
Admission: EM | Disposition: A | Discharge status: Home / Self Care | Payer: Self-pay | Source: Home / Self Care | Attending: Emergency Medicine

## 2021-02-07 ENCOUNTER — Observation Stay (HOSPITAL_COMMUNITY)
Admission: EM | Admit: 2021-02-07 | Discharge: 2021-02-08 | Disposition: A | Discharge status: Home / Self Care | Payer: 59 | Attending: Cardiology | Admitting: Cardiology

## 2021-02-07 ENCOUNTER — Encounter (HOSPITAL_COMMUNITY): Payer: Self-pay | Admitting: Emergency Medicine

## 2021-02-07 ENCOUNTER — Other Ambulatory Visit: Payer: Self-pay

## 2021-02-07 DIAGNOSIS — Z23 Encounter for immunization: Secondary | ICD-10-CM | POA: Diagnosis not present

## 2021-02-07 DIAGNOSIS — E785 Hyperlipidemia, unspecified: Secondary | ICD-10-CM

## 2021-02-07 DIAGNOSIS — I200 Unstable angina: Secondary | ICD-10-CM | POA: Diagnosis present

## 2021-02-07 DIAGNOSIS — D563 Thalassemia minor: Secondary | ICD-10-CM | POA: Diagnosis present

## 2021-02-07 DIAGNOSIS — R0789 Other chest pain: Secondary | ICD-10-CM

## 2021-02-07 DIAGNOSIS — I129 Hypertensive chronic kidney disease with stage 1 through stage 4 chronic kidney disease, or unspecified chronic kidney disease: Secondary | ICD-10-CM | POA: Insufficient documentation

## 2021-02-07 DIAGNOSIS — N1832 Chronic kidney disease, stage 3b: Secondary | ICD-10-CM | POA: Insufficient documentation

## 2021-02-07 DIAGNOSIS — I25110 Atherosclerotic heart disease of native coronary artery with unstable angina pectoris: Secondary | ICD-10-CM | POA: Diagnosis not present

## 2021-02-07 DIAGNOSIS — Z79899 Other long term (current) drug therapy: Secondary | ICD-10-CM | POA: Diagnosis not present

## 2021-02-07 DIAGNOSIS — I10 Essential (primary) hypertension: Secondary | ICD-10-CM

## 2021-02-07 DIAGNOSIS — Z20822 Contact with and (suspected) exposure to covid-19: Secondary | ICD-10-CM | POA: Diagnosis not present

## 2021-02-07 DIAGNOSIS — E782 Mixed hyperlipidemia: Secondary | ICD-10-CM

## 2021-02-07 DIAGNOSIS — E876 Hypokalemia: Secondary | ICD-10-CM | POA: Diagnosis not present

## 2021-02-07 DIAGNOSIS — N182 Chronic kidney disease, stage 2 (mild): Secondary | ICD-10-CM | POA: Diagnosis present

## 2021-02-07 DIAGNOSIS — D6489 Other specified anemias: Secondary | ICD-10-CM

## 2021-02-07 DIAGNOSIS — G4733 Obstructive sleep apnea (adult) (pediatric): Secondary | ICD-10-CM

## 2021-02-07 DIAGNOSIS — R079 Chest pain, unspecified: Secondary | ICD-10-CM

## 2021-02-07 HISTORY — PX: LEFT HEART CATH AND CORONARY ANGIOGRAPHY: CATH118249

## 2021-02-07 HISTORY — DX: Chronic kidney disease, stage 3 unspecified: N18.30

## 2021-02-07 HISTORY — DX: Hypokalemia: E87.6

## 2021-02-07 LAB — CBC
HCT: 29.2 % — ABNORMAL LOW (ref 39.0–52.0)
HCT: 27.8 % — ABNORMAL LOW (ref 39.0–52.0)
Hemoglobin: 8.9 g/dL — ABNORMAL LOW (ref 13.0–17.0)
Hemoglobin: 8.7 g/dL — ABNORMAL LOW (ref 13.0–17.0)
MCH: 23.1 pg — ABNORMAL LOW (ref 26.0–34.0)
MCH: 23 pg — ABNORMAL LOW (ref 26.0–34.0)
MCHC: 30.5 g/dL (ref 30.0–36.0)
MCHC: 31.3 g/dL (ref 30.0–36.0)
MCV: 75.8 fL — ABNORMAL LOW (ref 80.0–100.0)
MCV: 73.4 fL — ABNORMAL LOW (ref 80.0–100.0)
Platelets: 210 10*3/uL (ref 150–400)
Platelets: 196 10*3/uL (ref 150–400)
RBC: 3.85 MIL/uL — ABNORMAL LOW (ref 4.22–5.81)
RBC: 3.79 MIL/uL — ABNORMAL LOW (ref 4.22–5.81)
RDW: 17.5 % — ABNORMAL HIGH (ref 11.5–15.5)
RDW: 17.2 % — ABNORMAL HIGH (ref 11.5–15.5)
WBC: 4.6 10*3/uL (ref 4.0–10.5)
WBC: 4.3 10*3/uL (ref 4.0–10.5)
nRBC: 0 % (ref 0.0–0.2)
nRBC: 0 % (ref 0.0–0.2)

## 2021-02-07 LAB — BASIC METABOLIC PANEL
Anion gap: 9 (ref 5–15)
BUN: 19 mg/dL (ref 6–20)
CO2: 28 mmol/L (ref 22–32)
Calcium: 9.2 mg/dL (ref 8.9–10.3)
Chloride: 104 mmol/L (ref 98–111)
Creatinine, Ser: 1.38 mg/dL — ABNORMAL HIGH (ref 0.61–1.24)
GFR, Estimated: >60 mL/min (ref >60)
Glucose, Bld: 125 mg/dL — ABNORMAL HIGH (ref 70–99)
Potassium: 2.8 mmol/L — ABNORMAL LOW (ref 3.5–5.1)
Sodium: 141 mmol/L (ref 135–145)

## 2021-02-07 LAB — HEMOGLOBIN A1C
Hgb A1c MFr Bld: 5 % (ref 4.8–5.6)
Mean Plasma Glucose: 96.8 mg/dL

## 2021-02-07 LAB — HIV ANTIBODY (ROUTINE TESTING W REFLEX): HIV Screen 4th Generation wRfx: NONREACTIVE

## 2021-02-07 LAB — HEPATIC FUNCTION PANEL
ALT: 18 U/L (ref 0–44)
AST: 20 U/L (ref 15–41)
Albumin: 3 g/dL — ABNORMAL LOW (ref 3.5–5.0)
Alkaline Phosphatase: 89 U/L (ref 38–126)
Bilirubin, Direct: 0.2 mg/dL (ref 0.0–0.2)
Indirect Bilirubin: 0.9 mg/dL (ref 0.3–0.9)
Total Bilirubin: 1.1 mg/dL (ref 0.3–1.2)
Total Protein: 6.2 g/dL — ABNORMAL LOW (ref 6.5–8.1)

## 2021-02-07 LAB — CREATININE, SERUM
Creatinine, Ser: 1.26 mg/dL — ABNORMAL HIGH (ref 0.61–1.24)
GFR, Estimated: >60 mL/min (ref >60)

## 2021-02-07 LAB — POC OCCULT BLOOD, ED: Fecal Occult Bld: NEGATIVE

## 2021-02-07 LAB — TROPONIN I (HIGH SENSITIVITY)
Troponin I (High Sensitivity): 4 ng/L (ref <18)
Troponin I (High Sensitivity): 4 ng/L (ref <18)

## 2021-02-07 LAB — RESP PANEL BY RT-PCR (FLU A&B, COVID) ARPGX2
Influenza A by PCR: NEGATIVE
Influenza B by PCR: NEGATIVE
SARS Coronavirus 2 by RT PCR: NEGATIVE

## 2021-02-07 LAB — MAGNESIUM: Magnesium: 2.2 mg/dL (ref 1.7–2.4)

## 2021-02-07 LAB — TSH: TSH: 0.77 u[IU]/mL (ref 0.350–4.500)

## 2021-02-07 LAB — POTASSIUM: Potassium: 3.1 mmol/L — ABNORMAL LOW (ref 3.5–5.1)

## 2021-02-07 SURGERY — LEFT HEART CATH AND CORONARY ANGIOGRAPHY
Anesthesia: LOCAL

## 2021-02-07 MED ORDER — HEPARIN (PORCINE) IN NACL 1000-0.9 UT/500ML-% IV SOLN
INTRAVENOUS | Status: AC
  Filled 2021-02-07: qty 500

## 2021-02-07 MED ORDER — IOHEXOL 350 MG/ML SOLN
INTRAVENOUS | Status: DC | PRN
  Administered 2021-02-07: 70 mL via INTRA_ARTERIAL

## 2021-02-07 MED ORDER — ALUM & MAG HYDROXIDE-SIMETH 200-200-20 MG/5ML PO SUSP
30.0000 mL | Freq: Once | ORAL | Status: AC
  Administered 2021-02-07: 30 mL via ORAL
  Filled 2021-02-07: qty 30

## 2021-02-07 MED ORDER — CLONIDINE HCL 0.2 MG PO TABS
0.2000 mg | ORAL_TABLET | Freq: Every day | ORAL | Status: DC
  Filled 2021-02-07 (×2): qty 1

## 2021-02-07 MED ORDER — HYDRALAZINE HCL 20 MG/ML IJ SOLN: 10.0000 mg | INTRAMUSCULAR | Status: AC | PRN

## 2021-02-07 MED ORDER — SODIUM CHLORIDE 0.9% FLUSH
3.0000 mL | Freq: Two times a day (BID) | INTRAVENOUS | Status: DC
  Administered 2021-02-07: 3 mL via INTRAVENOUS

## 2021-02-07 MED ORDER — LABETALOL HCL 200 MG PO TABS
900.0000 mg | ORAL_TABLET | Freq: Two times a day (BID) | ORAL | Status: DC
  Administered 2021-02-07 – 2021-02-08 (×2): 900 mg via ORAL
  Filled 2021-02-07 (×2): qty 5

## 2021-02-07 MED ORDER — FERROUS SULFATE 325 (65 FE) MG PO TABS
325.0000 mg | ORAL_TABLET | Freq: Every day | ORAL | Status: DC
  Administered 2021-02-08: 325 mg via ORAL
  Filled 2021-02-07: qty 1

## 2021-02-07 MED ORDER — LABETALOL HCL 5 MG/ML IV SOLN
10.0000 mg | INTRAVENOUS | Status: AC | PRN
Start: 2021-02-07 — End: 2021-02-07

## 2021-02-07 MED ORDER — PANTOPRAZOLE SODIUM 40 MG PO TBEC
40.0000 mg | DELAYED_RELEASE_TABLET | Freq: Every day | ORAL | Status: DC
  Administered 2021-02-08: 40 mg via ORAL
  Filled 2021-02-07: qty 1

## 2021-02-07 MED ORDER — VERAPAMIL HCL 2.5 MG/ML IV SOLN
INTRAVENOUS | Status: AC
  Filled 2021-02-07: qty 2

## 2021-02-07 MED ORDER — SODIUM CHLORIDE 0.9% FLUSH: 3.0000 mL | INTRAVENOUS | Status: DC | PRN

## 2021-02-07 MED ORDER — IRBESARTAN 150 MG PO TABS
300.0000 mg | ORAL_TABLET | Freq: Every day | ORAL | Status: DC
  Administered 2021-02-08: 300 mg via ORAL
  Filled 2021-02-07: qty 2

## 2021-02-07 MED ORDER — OXYCODONE HCL 5 MG PO TABS
5.0000 mg | ORAL_TABLET | Freq: Once | ORAL | Status: AC
  Administered 2021-02-07: 5 mg via ORAL
  Filled 2021-02-07: qty 1

## 2021-02-07 MED ORDER — AMLODIPINE BESYLATE 10 MG PO TABS
10.0000 mg | ORAL_TABLET | Freq: Every day | ORAL | Status: DC
  Administered 2021-02-07: 10 mg via ORAL
  Filled 2021-02-07: qty 1

## 2021-02-07 MED ORDER — SODIUM CHLORIDE 0.9 % IV SOLN: INTRAVENOUS | Status: AC

## 2021-02-07 MED ORDER — MIDAZOLAM HCL 2 MG/2ML IJ SOLN
INTRAMUSCULAR | Status: DC | PRN
  Administered 2021-02-07: 2 mg via INTRAVENOUS

## 2021-02-07 MED ORDER — ASPIRIN 81 MG PO CHEW
81.0000 mg | CHEWABLE_TABLET | ORAL | Status: AC
  Administered 2021-02-07: 81 mg via ORAL
  Filled 2021-02-07: qty 1

## 2021-02-07 MED ORDER — POTASSIUM CHLORIDE CRYS ER 20 MEQ PO TBCR
40.0000 meq | EXTENDED_RELEASE_TABLET | Freq: Two times a day (BID) | ORAL | Status: DC
  Administered 2021-02-07 – 2021-02-08 (×2): 40 meq via ORAL
  Filled 2021-02-07 (×2): qty 2

## 2021-02-07 MED ORDER — INFLUENZA VAC SPLIT QUAD 0.5 ML IM SUSY
0.5000 mL | PREFILLED_SYRINGE | INTRAMUSCULAR | Status: AC
  Administered 2021-02-08: 0.5 mL via INTRAMUSCULAR
  Filled 2021-02-07: qty 0.5

## 2021-02-07 MED ORDER — DOCUSATE SODIUM 100 MG PO CAPS
200.0000 mg | ORAL_CAPSULE | Freq: Every day | ORAL | Status: DC
  Administered 2021-02-07: 200 mg via ORAL
  Filled 2021-02-07: qty 2

## 2021-02-07 MED ORDER — SODIUM CHLORIDE 0.9 % IV SOLN: 250.0000 mL | INTRAVENOUS | Status: DC | PRN

## 2021-02-07 MED ORDER — NITROGLYCERIN 0.4 MG SL SUBL: 0.4000 mg | SUBLINGUAL_TABLET | SUBLINGUAL | Status: DC | PRN

## 2021-02-07 MED ORDER — HEPARIN (PORCINE) IN NACL 1000-0.9 UT/500ML-% IV SOLN
INTRAVENOUS | Status: DC | PRN
  Administered 2021-02-07 (×2): 500 mL

## 2021-02-07 MED ORDER — ACETAMINOPHEN 325 MG PO TABS: 650.0000 mg | ORAL_TABLET | ORAL | Status: DC | PRN

## 2021-02-07 MED ORDER — SODIUM CHLORIDE 0.9 % IV SOLN: INTRAVENOUS | Status: DC

## 2021-02-07 MED ORDER — ONDANSETRON HCL 4 MG/2ML IJ SOLN: 4.0000 mg | Freq: Four times a day (QID) | INTRAMUSCULAR | Status: DC | PRN

## 2021-02-07 MED ORDER — HEPARIN SODIUM (PORCINE) 5000 UNIT/ML IJ SOLN
5000.0000 [IU] | Freq: Three times a day (TID) | INTRAMUSCULAR | Status: DC
  Administered 2021-02-07 – 2021-02-08 (×3): 5000 [IU] via SUBCUTANEOUS
  Filled 2021-02-07 (×3): qty 1

## 2021-02-07 MED ORDER — ASPIRIN EC 81 MG PO TBEC
81.0000 mg | DELAYED_RELEASE_TABLET | Freq: Every day | ORAL | Status: DC
  Administered 2021-02-08: 81 mg via ORAL
  Filled 2021-02-07: qty 1

## 2021-02-07 MED ORDER — ADULT MULTIVITAMIN W/MINERALS CH
1.0000 | ORAL_TABLET | Freq: Every day | ORAL | Status: DC
  Administered 2021-02-08: 1 via ORAL
  Filled 2021-02-07: qty 1

## 2021-02-07 MED ORDER — MIDAZOLAM HCL 2 MG/2ML IJ SOLN
INTRAMUSCULAR | Status: AC
  Filled 2021-02-07: qty 2

## 2021-02-07 MED ORDER — NITROGLYCERIN 2 % TD OINT
1.0000 [in_us] | TOPICAL_OINTMENT | Freq: Four times a day (QID) | TRANSDERMAL | Status: DC
  Administered 2021-02-07 – 2021-02-08 (×2): 1 [in_us] via TOPICAL
  Filled 2021-02-07: qty 30

## 2021-02-07 MED ORDER — POTASSIUM CHLORIDE CRYS ER 20 MEQ PO TBCR
40.0000 meq | EXTENDED_RELEASE_TABLET | Freq: Once | ORAL | Status: AC
  Administered 2021-02-07: 40 meq via ORAL
  Filled 2021-02-07: qty 2

## 2021-02-07 MED ORDER — METOPROLOL TARTRATE 25 MG PO TABS: 25.0000 mg | ORAL_TABLET | Freq: Two times a day (BID) | ORAL | Status: DC

## 2021-02-07 MED ORDER — ASPIRIN 300 MG RE SUPP: 300.0000 mg | RECTAL | Status: DC

## 2021-02-07 MED ORDER — FENTANYL CITRATE (PF) 100 MCG/2ML IJ SOLN
INTRAMUSCULAR | Status: DC | PRN
  Administered 2021-02-07: 25 ug via INTRAVENOUS

## 2021-02-07 MED ORDER — SODIUM CHLORIDE 0.9% FLUSH
3.0000 mL | Freq: Two times a day (BID) | INTRAVENOUS | Status: DC
  Administered 2021-02-08: 3 mL via INTRAVENOUS

## 2021-02-07 MED ORDER — HEPARIN SODIUM (PORCINE) 1000 UNIT/ML IJ SOLN
INTRAMUSCULAR | Status: AC
  Filled 2021-02-07: qty 1

## 2021-02-07 MED ORDER — VERAPAMIL HCL 2.5 MG/ML IV SOLN
INTRAVENOUS | Status: DC | PRN
  Administered 2021-02-07: 10 mL via INTRA_ARTERIAL

## 2021-02-07 MED ORDER — LIDOCAINE HCL (PF) 1 % IJ SOLN
INTRAMUSCULAR | Status: AC
  Filled 2021-02-07: qty 30

## 2021-02-07 MED ORDER — LIDOCAINE HCL (PF) 1 % IJ SOLN
INTRAMUSCULAR | Status: DC | PRN
  Administered 2021-02-07: 2 mL

## 2021-02-07 MED ORDER — HEPARIN SODIUM (PORCINE) 1000 UNIT/ML IJ SOLN
INTRAMUSCULAR | Status: DC | PRN
  Administered 2021-02-07: 4500 [IU] via INTRAVENOUS

## 2021-02-07 MED ORDER — ZOLPIDEM TARTRATE 5 MG PO TABS
5.0000 mg | ORAL_TABLET | Freq: Every evening | ORAL | Status: DC | PRN
Start: 2021-02-07 — End: 2021-02-08
  Administered 2021-02-08: 5 mg via ORAL
  Filled 2021-02-07 (×2): qty 1

## 2021-02-07 MED ORDER — ASPIRIN 81 MG PO CHEW: 324.0000 mg | CHEWABLE_TABLET | ORAL | Status: DC

## 2021-02-07 MED ORDER — HEPARIN SODIUM (PORCINE) 5000 UNIT/ML IJ SOLN: 5000.0000 [IU] | Freq: Three times a day (TID) | INTRAMUSCULAR | Status: DC

## 2021-02-07 MED ORDER — ACETAMINOPHEN 325 MG PO TABS
650.0000 mg | ORAL_TABLET | Freq: Four times a day (QID) | ORAL | Status: DC | PRN
  Administered 2021-02-07: 650 mg via ORAL
  Filled 2021-02-07: qty 2

## 2021-02-07 MED ORDER — FENTANYL CITRATE (PF) 100 MCG/2ML IJ SOLN
INTRAMUSCULAR | Status: AC
  Filled 2021-02-07: qty 2

## 2021-02-07 MED ORDER — ASPIRIN 81 MG PO CHEW: 81.0000 mg | CHEWABLE_TABLET | ORAL | Status: DC

## 2021-02-07 MED ORDER — POTASSIUM CHLORIDE 10 MEQ/100ML IV SOLN
10.0000 meq | Freq: Once | INTRAVENOUS | Status: AC
  Administered 2021-02-07: 10 meq via INTRAVENOUS
  Filled 2021-02-07: qty 100

## 2021-02-07 SURGICAL SUPPLY — 10 items
CATH OPTITORQUE TIG 4.0 5F (CATHETERS) ×1 IMPLANT
DEVICE RAD COMP TR BAND LRG (VASCULAR PRODUCTS) ×1 IMPLANT
GLIDESHEATH SLEND SS 6F .021 (SHEATH) ×1 IMPLANT
GUIDEWIRE INQWIRE 1.5J.035X260 (WIRE) IMPLANT
INQWIRE 1.5J .035X260CM (WIRE) ×2
KIT HEART LEFT (KITS) ×2 IMPLANT
PACK CARDIAC CATHETERIZATION (CUSTOM PROCEDURE TRAY) ×2 IMPLANT
SYR MEDRAD MARK 7 150ML (SYRINGE) ×2 IMPLANT
TRANSDUCER W/STOPCOCK (MISCELLANEOUS) ×2 IMPLANT
TUBING CIL FLEX 10 FLL-RA (TUBING) ×2 IMPLANT

## 2021-02-07 NOTE — H&P (Addendum)
Cardiology Admission History and Physical:   Patient ID: Devin Wilson; [id number]; 1966/05/24   Admission date: 02/07/2021  Primary Care Provider: Redmond School, MD Primary Cardiologist: Rozann Lesches, MD Primary Electrophysiologist: None  Chief Complaint: Recurrent left subscapular and chest discomfort, palpitations  Patient Profile:   Devin Wilson is a 55 y.o. male with a history of cardiac catheterization in 2013 demonstrating minor coronary luminal irregularities, hypertension, hyperlipidemia, chronic hypokalemia reportedly due to increased mineralocorticoid production (presumably not RTA), CKD stage IIIb, thalassemia minor with chronic anemia, and OSA.  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  He has been having symptoms intermittently for the last 3 to 4 weeks, actually just recently underwent a follow-up exercise Myoview on September 1 which was low risk, diaphragmatic attenuation noted but no obvious ischemia, LVEF 57%, occasional PVCs during exercise.  He subsequently underwent an EGD which did show some gastritis, but no obvious bleeding. Symptoms are not specifically exertional, sometimes worse with food, but on the other hand he has been waking up at night with significant upper back and chest discomfort that causes him to get up and pace that has been progressive over the last week.  Recent chest CTA on August 15 showed no obvious aortic dissection or aneurysm, no pulmonary embolus, tiny subpleural nodular density on the right upper lobe measuring 3 mm (unlikely to be causing any symptoms). He does not report any cough or hemoptysis, no fevers or chills.  Has been feeling somewhat fatigued.  Also describes a feeling of skipped heartbeat consistent with PVCs, no sudden dizziness or syncope.  Follow-up high-sensitivity troponin I levels today are completely normal.   Recent LDL 101.  Current creatinine 1.38 and potassium of 2.8 which is down from 3.4 in August.  He does mention that he cut back his regular potassium supplementation last week to see if this perhaps was contributing to any GI irritation.  His hemoglobin is 8.9 which is down from 10.5, low MCV of 75.  Has known history of thalassemia minor, no reported stool changes or obvious bleeding.  I personally reviewed his ECG today which shows sinus rhythm with incomplete right bundle branch block, nonspecific T wave abnormalities, and prolonged QT versus TU fusion.  Follow-up chest x-ray today describes cardiomegaly without acute process.   Past Medical History:  Diagnosis Date   CKD (chronic kidney disease) stage 3, GFR 30-59 ml/min (HCC)    GERD (gastroesophageal reflux disease)    Hyperlipidemia    Hypertension    Hypokalemia    Migraines    Sleep apnea    auto set-CPAP   Thalassemia minor     Past Surgical History:  Procedure Laterality Date   COLONOSCOPY  2006   Dr.Rourk @ [facility]   HERNIA REPAIR     "in infancy; umbilical"   KNEE ARTHROSCOPY WITH MEDIAL MENISECTOMY Left 03/24/2018   Procedure: KNEE ARTHROSCOPY WITH MEDIAL MENISECTOMY,LATERAL MENISECTOMY, CHONDROPLASTY MEDIAL FEMUR;  Surgeon: [name], MD;  Location: [facility];  Service: Orthopedics;  Laterality: Left;   LEFT HEART CATHETERIZATION WITH CORONARY ANGIOGRAM N/A 08/10/2011   Procedure: LEFT HEART CATHETERIZATION WITH CORONARY ANGIOGRAM;  Surgeon: [name], MD;  Location[facility] LAB;  Service: Cardiovascular;  Laterality: N/A;   VASECTOMY     WISDOM TOOTH EXTRACTION       Medications Prior to Admission: Prior to Admission medications   Medication Sig Start Date  End Date Taking? Authorizing Provider  acyclovir (ZOVIRAX) 800 MG tablet TAKE 1 TABLET EVERY DAY 02/02/20  Yes Hampton, Carla J, PA  amLODipine (NORVASC) 10 MG tablet Take 10 mg by mouth at bedtime.  10/31/17  Yes [provider]   cloNIDine (CATAPRES) 0.1 MG tablet Take 0.2 mg by mouth daily.   Yes [provider]  Docusate Sodium 100 MG capsule Take 200 mg by mouth at bedtime. Takes 2 tablets at bedtime.   Yes [provider]  EDARBI 80 MG TABS Take 1 tablet by mouth daily. 02/03/20  Yes [provider]  ferrous sulfate 325 (65 FE) MG tablet Take 325 mg by mouth daily with breakfast.   Yes [provider]  labetalol (NORMODYNE) 300 MG tablet Take 900 mg by mouth 2 (two) times daily. 10/31/17  Yes [provider]  Multiple Vitamin (MULTIVITAMIN WITH MINERALS) TABS tablet Take 1 tablet by mouth daily.   Yes [provider]  Omega-3 Fatty Acids (OMEGA 3 500 PO) Take by mouth daily.   Yes [provider]  pantoprazole (PROTONIX) 40 MG tablet Take 1 tablet (40 mg total) by mouth daily before breakfast. 02/04/21  Yes Rehman, Mechele Dawley, MD  Potassium Chloride ER 20 MEQ TBCR Take 2 tablets by mouth 2 (two) times daily. 01/12/20  Yes [provider]  tadalafil (CIALIS) 5 MG tablet Take 5 mg by mouth as needed.    Yes [provider]  Nutritional Supplements (DHEA PO) Take 30 mg by mouth as needed.  Patient not taking: Reported on 02/07/2021    [provider]     Allergies:    Allergies  Allergen Reactions   Ciprofloxacin Swelling    All flouroquinolones/Facial swelling   Levaquin [Levofloxacin In D5w]     Social History:   Social History   Tobacco Use   Smoking status: Never   Smokeless tobacco: Never  Substance Use Topics   Alcohol use: Yes    Alcohol/week: 6.0 standard drinks    Types: 1 Glasses of wine, 5 Standard drinks or equivalent per week     Family History:   The patient's family history includes Aneurysm in his father. There is no history of Colon polyps, Colon cancer, Esophageal cancer, Rectal cancer, or Stomach cancer.    ROS:  Please see the history of present illness.  All other ROS reviewed and negative.      Physical Exam/Data:   Vitals:   02/07/21 0930 02/07/21 1000 02/07/21 1030 02/07/21 1100  BP: 128/81 (!) 146/85 (!) 132/92 (!) 146/84  Pulse: 64 74 69 61  Resp: 16 15    Temp:      SpO2: 100% 100% 100% 100%  Weight:      Height:        Intake/Output Summary (Last 24 hours) at 02/07/2021 1105 Last data filed at 02/07/2021 0943 Gross per 24 hour  Intake 94.47 ml  Output --  Net 94.47 ml   Filed Weights   02/07/21 0626  Weight: 84 kg   Body mass index is 27.35 kg/m.   Gen: Patient appears comfortable at rest. HEENT: Conjunctiva and lids normal, wearing a mask. Neck: Supple, no elevated JVP or carotid bruits, no thyromegaly. Lungs: Clear to auscultation, nonlabored breathing at rest. Cardiac: Regular rate and rhythm with occasional ectopy, no S3 or significant systolic murmur, no pericardial rub. Abdomen: Soft, nontender,  bowel sounds present. Extremities: No pitting edema, distal pulses 2+. Skin: Warm and dry. Musculoskeletal: No kyphosis. Neuropsychiatric:  Alert and oriented x3, affect grossly appropriate.  Relevant CV Studies:  Exercise Myoview 01/26/2021:   The study is normal. The study is low risk. Occasional PVCs with exercise.   No ST deviation was noted.   There is a small mild intensity fixed inferior defect with normal wall motion. Finding is consistent with artifact secondary to subdiaphragmatic attenuation.   Left ventricular function is normal(57%). End diastolic cavity size is mildly enlarged.  Laboratory Data:  Chemistry Recent Labs  Lab 02/07/21 0655  NA 141  K 2.8*  CL 104  CO2 28  GLUCOSE 125*  BUN 19  CREATININE 1.38*  CALCIUM 9.2  GFRNONAA >60  ANIONGAP 9    No results for input(s): PROT, ALBUMIN, AST, ALT, ALKPHOS, BILITOT in the last 168 hours. Hematology Recent Labs  Lab 02/07/21 0655  WBC 4.6  RBC 3.85*  HGB 8.9*  HCT 29.2*  MCV 75.8*  MCH 23.1*  MCHC 30.5  RDW 17.5*  PLT 210   Cardiac Enzymes Recent Labs  Lab  01/09/21 0757 01/09/21 1018 02/07/21 0655 02/07/21 0828  TROPONINIHS '4 4 4 4    '$ Radiology/Studies:  DG Chest 2 View  Result Date: 02/07/2021 CLINICAL DATA:  LEFT-sided chest pain. EXAM: CHEST - 2 VIEW COMPARISON:  Chest radiograph, 01/09/2021.  CT chest, 01/09/2021. FINDINGS: Mild enlargement of the cardiac silhouette, unchanged. Tortuous thoracic aorta. Normal inflation. Trace perihilar interstitial thickening and blunting of the costophrenic angles. No large pleural effusion. No pneumothorax. No acute osseous abnormality. IMPRESSION: Cardiomegaly without acute superimposed cardiopulmonary process. Electronically Signed   By: Michaelle Birks M.D.   On: 02/07/2021 07:33    Assessment and Plan:   1.  Recurrent and progressive left subscapular and chest discomfort as described above, concerning for unstable angina, however associated with normal cardiac markers and no acute changes by ECG.  Symptoms are mixed and could also be potentially GI in origin, recent EGD did show gastritis.  Exercise Myoview was also recently low risk as discussed above.  Cardiac catheterization in 2013 revealed minor luminal irregularities, recent chest CT does show some coronary calcifications.  He is also feeling intermittent palpitations consistent with PVCs (PVCs also noted during exercise with his recent Myoview).  No aortic dissection or aneurysm, no pulmonary embolus by recent chest CTA.  2.  Chronic hypokalemia, states has been evaluated by endocrinology and nephrology with suspected increased mineralocorticoid production in the absence of definite adrenal tumor, presumably felt not to have RTA.  He is on potassium supplements at baseline, although did cut them back about a week ago wondering whether it might have been causing some GI irritation.  Potassium 2.8 at presentation down from 3.4, he has received both oral and IV supplementation in the ER.  3.  CKD stage IIIb, current creatinine 1.38.  4.  Hypertension,  on clonidine, labetalol, Edarbi and Norvasc as an outpatient.  5.  Mixed hyperlipidemia, on omega-3 supplements.  6.  OSA on CPAP.  7.  Chronic anemia with thalassemia minor, hemoglobin trend is lower than usual but no obvious bleeding noted.  Discussed recent symptoms and testing with Dr. Merlene Wilson.  We have reviewed the risk and benefits of proceeding with a follow-up diagnostic cardiac catheterization at this point to ensure no significant change in coronary anatomy that could be contributing to his worsening symptoms and was perhaps not picked up by the Myoview.  He is in agreement to proceed and this will be scheduled following transfer to [facility] today.  Starting gentle  hydration in the ER, potassium has already been supplemented.  Would not start heparin at this time.  Would also suggest a follow-up echocardiogram in light of his PVCs, and give consideration to an outpatient Zio patch to better quantify his PVC burden in case this is a potential contributor to symptoms.  He is already on fairly high-dose labetalol.  Signed, Rozann Lesches, MD  02/07/2021 11:05 AM

## 2021-02-07 NOTE — Interval H&P Note (Signed)
History and Physical Interval Note:  02/07/2021 2:49 PM  Devin Wilson  has presented today for surgery, with the diagnosis of unstable angina.  The various methods of treatment have been discussed with the patient and family. After consideration of risks, benefits and other options for treatment, the patient has consented to  Procedure(s): LEFT HEART CATH AND CORONARY ANGIOGRAPHY (N/A)  PERCUTANEOUS CORONARY INTERVENTION   as a surgical intervention.  The patient's history has been reviewed, patient examined, no change in status, stable for surgery.  I have reviewed the patient's chart and labs.  Questions were answered to the patient's satisfaction.    Cath Lab Visit (complete for each Cath Lab visit)  Clinical Evaluation Leading to the Procedure:   ACS: Yes.    Non-ACS:    Anginal Classification: CCS III  Anti-ischemic medical therapy: Maximal Therapy (2 or more classes of medications)  Non-Invasive Test Results: No non-invasive testing performed  Prior CABG: No previous CABG    [name]

## 2021-02-07 NOTE — ED Notes (Signed)
[facility] to pick up pt.

## 2021-02-07 NOTE — Progress Notes (Signed)
Dr. Ellyn Hack in to speak w/patient

## 2021-02-07 NOTE — Brief Op Note (Signed)
   BRIEF CARDIAC CATHETERIZATION REPORT  02/07/2021 3:41 PM  SURGEON:  Surgeon(s) and Role:    * [name], MD - Primary  PROCEDURE:  Procedure(s): LEFT HEART CATH AND CORONARY ANGIOGRAPHY (N/A)  PATIENT:  Devin Wilson  55 y.o. malewith a history of cardiac catheterization in 2013 demonstrating minor coronary luminal irregularities, hypertension, hyperlipidemia, chronic hypokalemia reportedly due to increased mineralocorticoid production (presumably not RTA), CKD stage IIIb, thalassemia minor with chronic anemia, and OSA.  He presented to [facility] ER earlier today (02/07/2021 with intermittent symptoms of left subscapular and precordial discomfort happening mostly in the evening.  This been going on for the last 3 to 4weeks.  He recently was evaluated with an exercise Myoview that was nonischemic but did suggest diaphragmatic attenuation.  With him now having recurrent symptoms, he is referred for invasive evaluation with cardiac catheterization and possible PCI.  PRE-OPERATIVE DIAGNOSIS:  unstable angina  POST-OPERATIVE DIAGNOSIS:   Angiographically minimal CAD-stable from 2013 Suspect nonmicrovascular/anginal chest pain  Time Out: Verified patient identification, verified procedure, site/side was marked, verified correct patient position, special equipment/implants available, medications/allergies/relevent history reviewed, required imaging and test results available. Performed.  Access:  RIGHT Radial Artery: 6 Fr sheath -- Seldinger technique using Micropuncture Kit -- Direct ultrasound guidance used.  Permanent image obtained and placed on chart. -- 10 mL radial cocktail IA; 4500 units IV Heparin  Left Heart Catheterization: 5 Fr TIG 4.0 catheter was advanced or exchanged over a J-wire under direct fluoroscopic guidance into the ascending aorta; the catheter was advanced across the aortic valve first for hemodynamics and LV gram.  The catheter was then pulled back  across the aortic valve for measurement of aortic valve gradient prior to being used to selectively engage the left and right coronary arteries for selective cineangiography.  Review of initial angiography revealed: Stable mild CAD with no obstructive lesions.  Stable from 2013  Upon completion of Angiogaphy, the catheter was removed completely out of the body over a wire, without complication.  Radial sheath removed in the Cardiac Catheterization lab with TR Band placed for hemostasis.  TR Band: 11 Hours; 1535 mL air  MEDICATIONS SQ Lidocaine 2 mL Radial Cocktail: 3 mg Verapmil in 10 mL NS Heparin: 4500 units  ANESTHESIA:   local and IV sedation 2 mg IV Versed, 25 mcg IV fentanyl  EBL:  <10 mL  COUNTS:  YES  DICTATION: .Note written in EPIC  PLAN OF CARE: Discharge to home in the morning after monitoring on telemetry overnight to see if episodes are related to arrhythmia.    PATIENT DISPOSITION:  PACU - hemodynamically stable.   Delay start of Pharmacological VTE agent (>24hrs) due to surgical blood loss or risk of bleeding: not applicable    [name], MD

## 2021-02-07 NOTE — ED Provider Notes (Signed)
Patient with atypical chest pain.  Cardiology consulted and Dr. Domenic Polite decided to do a cardiac cath on the patient.  Also the patient has hypokalemia which has been treated with p.o. and IV potassium.  His hemoglobin is slightly low at 8.9 it was 10.0 last time.  He has a history of thalassemia.  Rectal exam was done and it was heme-negative   Milton Ferguson, MD 02/07/21 1101

## 2021-02-07 NOTE — ED Notes (Signed)
Attempted report to [facility].

## 2021-02-07 NOTE — ED Triage Notes (Signed)
Pt c/o chest pain for a while that radiates to back and has been worse over the last week.

## 2021-02-07 NOTE — ED Provider Notes (Signed)
[facility] Emergency Department Provider Note MRN:  [id number]  Arrival date & time: 02/07/21     Chief Complaint   Chest Pain   History of Present Illness   Devin Wilson is a 55 y.o. year-old male with a history of hypertension thalassemia minor, CKD presenting to the ED with chief complaint of chest pain.  Location: Left chest and left thoracic back Duration: 3 weeks Onset: Sudden Timing: Constant Description: Pressure but sometimes sharp Severity: 8 out of 10 currently Exacerbating/Alleviating Factors: Worse after meals sometimes, worse when lying flat at night Associated Symptoms: Palpitations Pertinent Negatives: Denies dizziness or sweatiness, no nausea vomiting, no shortness of breath, no black stool, no bloody stool  Additional History: None  Review of Systems  A complete 10 system review of systems was obtained and all systems are negative except as noted in the HPI and PMH.   Patient's Health History    Past Medical History:  Diagnosis Date   Angina    Bradycardia, sinus    Chest discomfort    Chest pain    a. 3.15.2013 Cath: Minor Irregs   Chronic kidney disease    mild kidney failure stage 2-3   Dyslipidemia    GERD (gastroesophageal reflux disease)    OTC PRN meds   Hyperlipidemia    on meds   Hypertension    on meds   Migraines    Renal insufficiency    Sleep apnea    auto set-CPAP   Thalassemia minor    hx of   Thalassemia minor     Past Surgical History:  Procedure Laterality Date   CARDIAC CATHETERIZATION  08/10/11   COLONOSCOPY  2006   Dr.Rourk @ [facility]   HERNIA REPAIR     "in infancy; umbilical"   KNEE ARTHROSCOPY WITH MEDIAL MENISECTOMY Left 03/24/2018   Procedure: KNEE ARTHROSCOPY WITH MEDIAL MENISECTOMY,LATERAL MENISECTOMY, CHONDROPLASTY MEDIAL FEMUR;  Surgeon: [name], MD;  Location: [facility];  Service: Orthopedics;  Laterality: Left;   LEFT HEART CATHETERIZATION WITH CORONARY ANGIOGRAM N/A  08/10/2011   Procedure: LEFT HEART CATHETERIZATION WITH CORONARY ANGIOGRAM;  Surgeon: [name], MD;  Location: [facility] CATH LAB;  Service: Cardiovascular;  Laterality: N/A;   VASECTOMY     WISDOM TOOTH EXTRACTION      Family History  Problem Relation Age of Onset   Aneurysm Father    Colon polyps Neg Hx    Colon cancer Neg Hx    Esophageal cancer Neg Hx    Rectal cancer Neg Hx    Stomach cancer Neg Hx     Social History   Socioeconomic History   Marital status: Married    Spouse name: Not on file   Number of children: Not on file   Years of education: Not on file   Highest education level: Not on file  Occupational History   Occupation: Physician    Comment: Neurologist  Tobacco Use   Smoking status: Never   Smokeless tobacco: Never  Vaping Use   Vaping Use: Never used  Substance and Sexual Activity   Alcohol use: Yes    Alcohol/week: 6.0 standard drinks    Types: 1 Glasses of wine, 5 Standard drinks or equivalent per week   Drug use: No   Sexual activity: Yes    Partners: Female  Other Topics Concern   Not on file  Social History Narrative   Not on file   Social Determinants of Health   Financial Resource Strain: Not  on file  Food Insecurity: Not on file  Transportation Needs: Not on file  Physical Activity: Not on file  Stress: Not on file  Social Connections: Not on file  Intimate Partner Violence: Not on file     Physical Exam   Vitals:   02/07/21 0623  BP: (!) 144/88  Pulse: 71  Resp: 18  Temp: 98 F (36.7 C)  SpO2: 100%    CONSTITUTIONAL: Well-appearing, NAD NEURO:  Alert and oriented x 3, no focal deficits EYES:  eyes equal and reactive ENT/NECK:  no LAD, no JVD CARDIO: Regular rate, well-perfused, normal S1 and S2 PULM:  CTAB no wheezing or rhonchi GI/GU:  normal bowel sounds, non-distended, non-tender MSK/SPINE:  No gross deformities, no edema SKIN:  no rash, atraumatic PSYCH:  Appropriate speech and behavior  *Additional and/or  pertinent findings included in MDM below  Diagnostic and Interventional Summary    EKG Interpretation  Date/Time:  Tuesday February 07 2021 06:27:32 EDT Ventricular Rate:  79 PR Interval:  180 QRS Duration: 108 QT Interval:  408 QTC Calculation: 467 R Axis:   60 Text Interpretation: Normal sinus rhythm Incomplete right bundle branch block Nonspecific T wave abnormality Prolonged QT Abnormal ECG No significant change was found Confirmed by Gerlene Fee [phone]) on 02/07/2021 6:47:18 AM       Labs Reviewed  CBC  BASIC METABOLIC PANEL  TROPONIN I (HIGH SENSITIVITY)    DG Chest 2 View    (Results Pending)    Medications  alum & mag hydroxide-simeth (MAALOX/MYLANTA) 200-200-20 MG/5ML suspension 30 mL (has no administration in time range)  oxyCODONE (Oxy IR/ROXICODONE) immediate release tablet 5 mg (has no administration in time range)     Procedures  /  Critical Care Procedures  ED Course and Medical Decision Making  I have reviewed the triage vital signs, the nursing notes, and pertinent available records from the EMR.  Listed above are laboratory and imaging tests that I personally ordered, reviewed, and interpreted and then considered in my medical decision making (see below for details).  Chest pain for the past 3 weeks, has undergone a great deal of diagnostic testing thus far.  CTA dissection study obtained in the emergency department few weeks ago was normal.  Patient has since had a stress test which was reassuring.  Had an EGD demonstrating some gastropathy.  Omeprazole not really helping, has increased the dose per GI recommendations.  Overall favoring a GI cause but will obtain no evidence of DVT, no hypoxia, no tachycardia, no indication for advanced imaging of the chest today signed out to oncoming provider at shift change.       Barth Kirks. [place]onia Small, [place] [email or web address]  Final Clinical Impressions(s) /  ED Diagnoses     ICD-10-CM   1. Chest pain, unspecified type  R07.9     2. Chest pain  R07.9 DG Chest 2 View    DG Chest 2 View      ED Discharge Orders     None        Discharge Instructions Discussed with and Provided to Patient:   Discharge Instructions   None       Maudie Flakes, MD 02/07/21 [phone]

## 2021-02-08 ENCOUNTER — Observation Stay (HOSPITAL_BASED_OUTPATIENT_CLINIC_OR_DEPARTMENT_OTHER): Payer: 59

## 2021-02-08 ENCOUNTER — Encounter (HOSPITAL_COMMUNITY): Payer: Self-pay | Admitting: Cardiology

## 2021-02-08 ENCOUNTER — Other Ambulatory Visit: Payer: Self-pay

## 2021-02-08 DIAGNOSIS — Z20822 Contact with and (suspected) exposure to covid-19: Secondary | ICD-10-CM | POA: Diagnosis not present

## 2021-02-08 DIAGNOSIS — I200 Unstable angina: Secondary | ICD-10-CM

## 2021-02-08 DIAGNOSIS — E876 Hypokalemia: Secondary | ICD-10-CM

## 2021-02-08 DIAGNOSIS — R079 Chest pain, unspecified: Secondary | ICD-10-CM

## 2021-02-08 DIAGNOSIS — G4733 Obstructive sleep apnea (adult) (pediatric): Secondary | ICD-10-CM

## 2021-02-08 DIAGNOSIS — I129 Hypertensive chronic kidney disease with stage 1 through stage 4 chronic kidney disease, or unspecified chronic kidney disease: Secondary | ICD-10-CM | POA: Diagnosis not present

## 2021-02-08 DIAGNOSIS — I25110 Atherosclerotic heart disease of native coronary artery with unstable angina pectoris: Secondary | ICD-10-CM | POA: Diagnosis not present

## 2021-02-08 DIAGNOSIS — N1832 Chronic kidney disease, stage 3b: Secondary | ICD-10-CM | POA: Diagnosis not present

## 2021-02-08 DIAGNOSIS — E785 Hyperlipidemia, unspecified: Secondary | ICD-10-CM

## 2021-02-08 LAB — ECHOCARDIOGRAM COMPLETE
AR max vel: 2.05 cm2
AV Area VTI: 1.94 cm2
AV Area mean vel: 2.23 cm2
AV Mean grad: 5 mmHg
AV Peak grad: 10.1 mmHg
Ao pk vel: 1.59 m/s
Area-P 1/2: 3.05 cm2
Height: 69 in
S' Lateral: 2.8 cm
Weight: 2962.98 oz

## 2021-02-08 LAB — BASIC METABOLIC PANEL
Anion gap: 6 (ref 5–15)
BUN: 14 mg/dL (ref 6–20)
CO2: 28 mmol/L (ref 22–32)
Calcium: 8.7 mg/dL — ABNORMAL LOW (ref 8.9–10.3)
Chloride: 104 mmol/L (ref 98–111)
Creatinine, Ser: 1.35 mg/dL — ABNORMAL HIGH (ref 0.61–1.24)
GFR, Estimated: >60 mL/min (ref >60)
Glucose, Bld: 95 mg/dL (ref 70–99)
Potassium: 3.2 mmol/L — ABNORMAL LOW (ref 3.5–5.1)
Sodium: 138 mmol/L (ref 135–145)

## 2021-02-08 LAB — CBC
HCT: 28.2 % — ABNORMAL LOW (ref 39.0–52.0)
Hemoglobin: 9 g/dL — ABNORMAL LOW (ref 13.0–17.0)
MCH: 23.4 pg — ABNORMAL LOW (ref 26.0–34.0)
MCHC: 31.9 g/dL (ref 30.0–36.0)
MCV: 73.4 fL — ABNORMAL LOW (ref 80.0–100.0)
Platelets: 206 10*3/uL (ref 150–400)
RBC: 3.84 MIL/uL — ABNORMAL LOW (ref 4.22–5.81)
RDW: 16.9 % — ABNORMAL HIGH (ref 11.5–15.5)
WBC: 4.4 10*3/uL (ref 4.0–10.5)
nRBC: 0 % (ref 0.0–0.2)

## 2021-02-08 MED ORDER — POTASSIUM CHLORIDE CRYS ER 20 MEQ PO TBCR: 40.0000 meq | EXTENDED_RELEASE_TABLET | Freq: Once | ORAL | Status: DC

## 2021-02-08 MED ORDER — SPIRONOLACTONE 12.5 MG HALF TABLET
12.5000 mg | ORAL_TABLET | Freq: Every day | ORAL | Status: DC
  Administered 2021-02-08: 12.5 mg via ORAL
  Filled 2021-02-08: qty 1

## 2021-02-08 MED ORDER — SPIRONOLACTONE 25 MG PO TABS
12.5000 mg | ORAL_TABLET | Freq: Every day | ORAL | 3 refills | Status: DC
Start: 2021-02-08 — End: 2021-04-13

## 2021-02-08 NOTE — Discharge Summary (Addendum)
Discharge Summary    Patient ID: Devin Wilson MRN: [id number]; DOB: 1965/11/12  Admit date: 02/07/2021 Discharge date: 02/08/2021  PCP:  Redmond School, MD   [facility] Providers Cardiologist:  Rozann Lesches, MD  Discharge Diagnoses    Principal Problem:   Unstable angina [facility]) Active Problems:   Thalassemia minor   Primary hypertension   Stage 2 chronic kidney disease   Atypical chest pain   Hyperlipidemia   Chronic hypokalemia   OSA on CPAP    Diagnostic Studies/Procedures    LHC 02/07/21:   Minimal to mild disease in the mid RCA and mild diffuse disease in the LAD with tapering after diagonal branch.  No significant lesion.   The left ventricular systolic function is normal.  The left ventricular ejection fraction is 55-65% by visual estimate.   LV end diastolic pressure is normal.   There is no aortic valve stenosis.   SUMMARY: Angiographically minimal CAD-stable from 2013 Suspect nonmicrovascular/anginal chest pain Normal LVEF and EDP.     RECOMMENDATIONS Monitor overnight on telemetry to ensure that these episodes of chest discomfort not related to any type of arrhythmia.  Otherwise anticipate discharge tomorrow. He does have a 2D echocardiogram ordered which can be completed.  Diagnostic Dominance: Right  _____________   History of Present Illness     Devin Wilson is a 55 y.o. male with a PMH of minimal CAD on cath in 2013, HTN, HLD, thalassemia minor with chronic anemia, chronic hypokalemia 2/2 increased mineralocorticoid production (presumably not RTA), CKD stage 3b, and OSA, who presented to [facility] with complaints of worsening left subscapular and precordial chest discomfort for the past week. He underwent a NST 01/26/21 to evaluate these complaints which was low risk with diaphragmatic attenuation but no ischemia; EF 57%. He then underwent an EGD which showed some gastritis but no bleeding. Symptoms are not specifically exertional, sometimes worse with  food, but on the other hand he has been waking up at night with significant upper back and chest discomfort that causes him to get up and pace that has been progressive over the last week.  Recent chest CTA on August 15 showed no obvious aortic dissection or aneurysm, no pulmonary embolus, tiny subpleural nodular density on the right upper lobe measuring 3 mm (unlikely to be causing any symptoms). He does not report any cough or hemoptysis, no fevers or chills.  Has been feeling somewhat fatigued.  Also describes a feeling of skipped heartbeat consistent with PVCs, no sudden dizziness or syncope. Given worsening symptoms he presented to the ED for further evaluation.   Follow-up high-sensitivity troponin I levels today are completely normal.  Recent LDL 101.  Current creatinine 1.38 and potassium of 2.8 which is down from 3.4 in August.  He does mention that he cut back his regular potassium supplementation last week to see if this perhaps was contributing to any GI irritation.  His hemoglobin is 8.9 which is down from 10.5, low MCV of 75.  Has known history of thalassemia minor, no reported stool changes or obvious bleeding.   I personally reviewed his ECG today which shows sinus rhythm with incomplete right bundle branch block, nonspecific T wave abnormalities, and prolonged QT versus TU fusion.   Follow-up chest x-ray today describes cardiomegaly without acute process.    Hospital Course     Consultants: None   1. Unstable angina in patient with minimal CAD: patient has had intermittent subscapular and precordial chest pain for the past 3-4 weeks. Recently underwent  NST 01/26/21 which showed no ischemia. Also underwent EGD which showed mild gastritis but no bleeding. He presented to [facility] with worsening symptoms. HsTrop was negative. EKG non-ischemic. He was recommended to transfer to [facility] for [place] which showed continued minimal RCA and LAD disease with EF 55-65%. Echocardiogram prelim read showed normal  LVSF, G2DD, and LVH - No cardiac indication to continue aspirin '81mg'$  daily given only minimal CAD - possible aspirin is contributing to GI discomfort - Consider outpatient PYP scan to evaluate for amyloidosis in a gentleman with LVH, HTN, and CKD.   2. HTN: BP is intermittently elevated but overall stable. Home edarbi substituted for hospital formulary irbesartan. Decision made to start low dose spironolactone 12.'5mg'$  daily which may help with hypokalemia - Continue amlodipine, clonidine, edarbi, labetalol, and spironolactone - Would check BMET at follow-up to ensure tolerance  3. HLD: LDL 101. Given minimal disease on LHC this admission, will hold off on starting statin.  - Continue omega   4. PVCs: he is quite symptomatic with his PVCs. No runs of NSVT noted on telemetry overnight.   5. Chronic hypokalemia: K 2.8 on presentation after self reducing home potassium supplement . He has been repleted. Will give additional 40 mEq today - Continue po potassium supplementation 40 mEq BID  6. OSA: on CPAP - Continue CPAP  7. Chronic anemia with thalassemia minor: Hgb stable at 9.0 on the day of discharge - Continue close outpatient monitoring  8. CKD stage 2: Cr actually improved this admission. Stable at 1.35 on the day of discharge - Continue routine outpatient monitoring   Did the patient have an acute coronary syndrome (MI, NSTEMI, STEMI, etc) this admission?:  No                               Did the patient have a percutaneous coronary intervention (stent / angioplasty)?:  No.       _____________  Discharge Vitals Blood pressure 135/81, pulse 68, temperature 98.3 F (36.8 C), temperature source Oral, resp. rate 18, height '5\' 9"'$  (1.753 m), weight 84 kg, SpO2 99 %.  Filed Weights   02/07/21 0626  Weight: 84 kg    Labs & Radiologic Studies    CBC Recent Labs    02/07/21 2050 02/08/21 0300  WBC 4.3 4.4  HGB 8.7* 9.0*  HCT 27.8* 28.2*  MCV 73.4* 73.4*  PLT 196 [id number]    Basic Metabolic Panel Recent Labs    02/07/21 0655 02/07/21 1138 02/07/21 1707 02/07/21 2050 02/08/21 0300  NA 141  --   --   --  138  K 2.8* 3.1*  --   --  3.2*  CL 104  --   --   --  104  CO2 28  --   --   --  28  GLUCOSE 125*  --   --   --  95  BUN 19  --   --   --  14  CREATININE 1.38*  --   --  1.26* 1.35*  CALCIUM 9.2  --   --   --  8.7*  MG  --   --  2.2  --   --    Liver Function Tests Recent Labs    02/07/21 2050  AST 20  ALT 18  ALKPHOS 89  BILITOT 1.1  PROT 6.2*  ALBUMIN 3.0*   No results for input(s): LIPASE, AMYLASE in the last 72 hours.  High Sensitivity Troponin:   Recent Labs  Lab 02/07/21 0655 02/07/21 0828  TROPONINIHS 4 4    BNP Invalid input(s): POCBNP D-Dimer No results for input(s): DDIMER in the last 72 hours. Hemoglobin A1C Recent Labs    02/07/21 1707  HGBA1C 5.0   Fasting Lipid Panel No results for input(s): CHOL, HDL, LDLCALC, TRIG, CHOLHDL, LDLDIRECT in the last 72 hours. Thyroid Function Tests Recent Labs    02/07/21 1707  TSH 0.770   _____________  DG Chest 2 View  Result Date: 02/07/2021 CLINICAL DATA:  LEFT-sided chest pain. EXAM: CHEST - 2 VIEW COMPARISON:  Chest radiograph, 01/09/2021.  CT chest, 01/09/2021. FINDINGS: Mild enlargement of the cardiac silhouette, unchanged. Tortuous thoracic aorta. Normal inflation. Trace perihilar interstitial thickening and blunting of the costophrenic angles. No large pleural effusion. No pneumothorax. No acute osseous abnormality. IMPRESSION: Cardiomegaly without acute superimposed cardiopulmonary process. Electronically Signed   By: Michaelle Birks M.D.   On: 02/07/2021 07:33   CARDIAC CATHETERIZATION  Result Date: 02/07/2021   Minimal to mild disease in the mid RCA and mild diffuse disease in the LAD with tapering after diagonal branch.  No significant lesion.   The left ventricular systolic function is normal.  The left ventricular ejection fraction is 55-65% by visual estimate.   LV  end diastolic pressure is normal.   There is no aortic valve stenosis. SUMMARY: Angiographically minimal CAD-stable from 2013 Suspect nonmicrovascular/anginal chest pain Normal LVEF and EDP. RECOMMENDATIONS Monitor overnight on telemetry to ensure that these episodes of chest discomfort not related to any type of arrhythmia.  Otherwise anticipate discharge tomorrow. He does have a 2D echocardiogram ordered which can be completed. [name], MD  NM Myocar Multi W/Spect Tamela Oddi Motion / EF  Result Date: 01/26/2021   The study is normal. The study is low risk. Occasional PVCs with exercise.   No ST deviation was noted.   There is a small mild intensity fixed inferior defect with normal wall motion. Finding is consistent with artifact secondary to subdiaphragmatic attenuation.   Left ventricular function is normal(57%). End diastolic cavity size is mildly enlarged.   Disposition   Pt is being discharged home today in good condition.  Follow-up Plans & Appointments     Follow-up Information     Charlie Pitter, PA-C Follow up on 03/06/2021.   Specialties: Cardiology, Radiology Why: Please arrive 15 minutes early for your 3:30pm post-hospital cardiology appointment Contact information: [place] [address] [phone]                  Discharge Medications   Allergies as of 02/08/2021       Reactions   Ciprofloxacin Swelling   All flouroquinolones/Facial swelling   Levaquin [levofloxacin In D5w]         Medication List     STOP taking these medications    DHEA PO       TAKE these medications    acyclovir 800 MG tablet Commonly known as: ZOVIRAX TAKE 1 TABLET EVERY DAY   amLODipine 10 MG tablet Commonly known as: NORVASC Take 10 mg by mouth at bedtime.   cloNIDine 0.1 MG tablet Commonly known as: CATAPRES Take 0.2 mg by mouth daily.   Docusate Sodium 100 MG capsule Take 200 mg by mouth at bedtime. Takes 2 tablets at bedtime.   Edarbi 80 MG  Tabs Generic drug: Azilsartan Medoxomil Take 1 tablet by mouth daily.   ferrous sulfate 325 (65 FE) MG tablet  Take 325 mg by mouth daily with breakfast.   labetalol 300 MG tablet Commonly known as: NORMODYNE Take 900 mg by mouth 2 (two) times daily.   multivitamin with minerals Tabs tablet Take 1 tablet by mouth daily.   OMEGA 3 500 PO Take by mouth daily.   pantoprazole 40 MG tablet Commonly known as: PROTONIX Take 1 tablet (40 mg total) by mouth daily before breakfast.   Potassium Chloride ER 20 MEQ Tbcr Take 2 tablets by mouth 2 (two) times daily.   spironolactone 25 MG tablet Commonly known as: ALDACTONE Take 0.5 tablets (12.5 mg total) by mouth daily.   tadalafil 5 MG tablet Commonly known as: CIALIS Take 5 mg by mouth as needed.           Outstanding Labs/Studies   Check BMET at follow-up visit  Follow-up final echocardiogram read  Duration of Discharge Encounter   Greater than 30 minutes including physician time.  Signed, Abigail Butts, PA-C 02/08/2021, 10:38 AM  Personally seen and examined. Agree with above.  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  Normal EF.  His LV wall is 1.4 cm, mild renal dysfunction, pseudonormalization on echocardiogram.  May wish to consider amyloid, transthyretin work-up with PYP scan as outpatient.  We also started him back on spironolactone 12.5 mg daily.  He knows to watch out for gynecomastia.  Mild hypokalemia noted.  Ambulating well.  Okay to go home.  Candee Furbish, MD

## 2021-02-08 NOTE — Progress Notes (Signed)
Left voice mail message with selita, pt admitted to [facility] 02-07-2021 and is still inpatient with chest pain and will need cardiac clearance prior to 02-14-2021 surgery

## 2021-02-08 NOTE — Progress Notes (Signed)
Patient given discharge instructions and stated understanding. 

## 2021-02-08 NOTE — Discharge Instructions (Addendum)

## 2021-02-09 ENCOUNTER — Other Ambulatory Visit (INDEPENDENT_AMBULATORY_CARE_PROVIDER_SITE_OTHER): Payer: Self-pay | Admitting: Internal Medicine

## 2021-02-09 ENCOUNTER — Ambulatory Visit (HOSPITAL_COMMUNITY)
Admission: RE | Admit: 2021-02-09 | Discharge: 2021-02-09 | Disposition: A | Discharge status: Home / Self Care | Payer: 59 | Source: Ambulatory Visit | Attending: Internal Medicine | Admitting: Internal Medicine

## 2021-02-09 ENCOUNTER — Other Ambulatory Visit: Payer: Self-pay | Admitting: Cardiology

## 2021-02-09 ENCOUNTER — Other Ambulatory Visit: Payer: Self-pay

## 2021-02-09 ENCOUNTER — Ambulatory Visit (INDEPENDENT_AMBULATORY_CARE_PROVIDER_SITE_OTHER): Payer: 59

## 2021-02-09 DIAGNOSIS — R0789 Other chest pain: Secondary | ICD-10-CM | POA: Diagnosis present

## 2021-02-09 DIAGNOSIS — I493 Ventricular premature depolarization: Secondary | ICD-10-CM

## 2021-02-09 DIAGNOSIS — I428 Other cardiomyopathies: Secondary | ICD-10-CM

## 2021-02-09 DIAGNOSIS — G8929 Other chronic pain: Secondary | ICD-10-CM

## 2021-02-09 DIAGNOSIS — R1013 Epigastric pain: Secondary | ICD-10-CM | POA: Insufficient documentation

## 2021-02-09 MED ORDER — DICYCLOMINE HCL 10 MG PO CAPS: 10.0000 mg | ORAL_CAPSULE | Freq: Three times a day (TID) | ORAL | 0 refills | Status: DC | PRN

## 2021-02-10 ENCOUNTER — Other Ambulatory Visit (INDEPENDENT_AMBULATORY_CARE_PROVIDER_SITE_OTHER): Payer: Self-pay

## 2021-02-10 DIAGNOSIS — R1013 Epigastric pain: Secondary | ICD-10-CM

## 2021-02-13 ENCOUNTER — Encounter (HOSPITAL_COMMUNITY): Payer: Self-pay | Admitting: Internal Medicine

## 2021-02-13 NOTE — Progress Notes (Signed)
Cardiology Office Note    Date:  02/15/2021   ID:  [name], DOB Sep 22, 1965, MRN [id number]   PCP:  [facility], [place]  Cardiologist:  Rozann Lesches, MD   Advanced Practice Provider:  No care team member to display Electrophysiologist:  None   [phone]   Chief Complaint  Patient presents with   Pre-op Exam   Hospitalization Follow-up    History of Present Illness:  Devin Wilson is a 55 y.o. male with a PMH of minimal CAD on cath in 2013, HTN, HLD, thalassemia minor with chronic anemia, chronic hypokalemia 2/2 increased mineralocorticoid production (presumably not RTA), CKD stage 3b, and OSA,  He underwent a NST 01/26/21 to evaluate these complaints which was low risk with diaphragmatic attenuation but no ischemia; EF 57%. He then underwent an EGD which showed some gastritis but no bleeding.  Chest CT 01/09/2021 no aortic dissection or aneurysm or PE.  Tiny subpleural nodular density on the right upper lobe 3 mm unlikely causing any symptoms.  Patient readmitted with chest pain 02/07/2021 and underwent cardiac catheterization that showed minimal RCA and LAD disease EF 55 to 65%.  No cardiac indication to continue aspirin 81 mg given only minimal CAD and felt it could be contributing to his GI discomfort.  2D echo showed left ventricular apex and RV are heavily trabeculated consider cardiac MRI to assess ventricular noncompaction.  LVEF 60 to 65% mild LVH.  Grade 2 DD.  He was having palpitations and PVCs so 72-hour monitor was ordered.  Potassium was 2.8 on presentation after self reducing home potassium supplement.  He was repleted.  Patient comes in for hospital f/u but also preop clearance for circumcision by Dr. Harold Barban, [facility]. Patient still having discomfort in chest and back sometimes associated with PVC's other times not. More noticeable at night. Pain can last hours and more  noticeable at rest. Having body aches and  taking tylenol at night. Was in [place] 2 weeks in July and wondering if he could have symptoms of subclinical malaria. Has had body aches as well, no feverHe did take malarone prophylactic. Hbg lower than his usual anemia. He had lab work yest and K 4.0.     Past Medical History:  Diagnosis Date   CKD (chronic kidney disease) stage 3, GFR 30-59 ml/min (HCC)    GERD (gastroesophageal reflux disease)    Hyperlipidemia    Hypertension    Hypokalemia    Migraines    Sleep apnea    auto set-CPAP   Thalassemia minor     Past Surgical History:  Procedure Laterality Date   BIOPSY  02/02/2021   Procedure: BIOPSY;  Surgeon: [name], MD;  [facility]: [facility];  Service: Endoscopy;;   COLONOSCOPY  2006   Dr.Rourk @ [facility]   ESOPHAGOGASTRODUODENOSCOPY (EGD) WITH PROPOFOL N/A 02/02/2021   Procedure: ESOPHAGOGASTRODUODENOSCOPY (EGD) WITH PROPOFOL;  Surgeon: [name], MD;  Location: [facility];  Service: Endoscopy;  Laterality: N/A;  [place]     "in infancy; umbilical"   KNEE ARTHROSCOPY WITH MEDIAL MENISECTOMY Left 03/24/2018   Procedure: KNEE ARTHROSCOPY WITH MEDIAL MENISECTOMY,LATERAL MENISECTOMY, CHONDROPLASTY MEDIAL FEMUR;  Surgeon: [name], MD;  Location: [facility];  Service: Orthopedics;  Laterality: Left;   LEFT HEART CATH AND CORONARY ANGIOGRAPHY N/A 02/07/2021   Procedure: LEFT HEART CATH AND CORONARY ANGIOGRAPHY;  Surgeon: [name], MD;  Location: [facility];  Service: Cardiovascular;  Laterality: N/A;   LEFT HEART CATHETERIZATION WITH CORONARY ANGIOGRAM N/A 08/10/2011   Procedure: LEFT HEART CATHETERIZATION WITH CORONARY ANGIOGRAM;  Surgeon: [name], MD;  Location: [facility];  Service: Cardiovascular;  Laterality: N/A;   VASECTOMY     WISDOM TOOTH EXTRACTION      Current Medications: Current Meds  Medication Sig   acyclovir (ZOVIRAX) 800 MG tablet TAKE 1 TABLET EVERY DAY   amLODipine (NORVASC) 10 MG tablet  Take 10 mg by mouth at bedtime.    cloNIDine (CATAPRES) 0.1 MG tablet Take 0.2 mg by mouth daily.   Docusate Sodium 100 MG capsule Take 200 mg by mouth at bedtime. Takes 2 tablets at bedtime.   EDARBI 80 MG TABS Take 1 tablet by mouth daily.   ferrous sulfate 325 (65 FE) MG tablet Take 325 mg by mouth daily with breakfast.   labetalol (NORMODYNE) 300 MG tablet Take 900 mg by mouth 2 (two) times daily.   Multiple Vitamin (MULTIVITAMIN WITH MINERALS) TABS tablet Take 1 tablet by mouth daily.   Omega-3 Fatty Acids (OMEGA 3 500 PO) Take by mouth daily.   pantoprazole (PROTONIX) 40 MG tablet Take 1 tablet (40 mg total) by mouth daily before breakfast.   Potassium Chloride ER 20 MEQ TBCR Take 2 tablets by mouth 2 (two) times daily.   spironolactone (ALDACTONE) 25 MG tablet Take 0.5 tablets (12.5 mg total) by mouth daily.   tadalafil (CIALIS) 5 MG tablet Take 5 mg by mouth as needed.      Allergies:   Ciprofloxacin and Levaquin [levofloxacin in d5w]   Social History   Socioeconomic History   Marital status: Married    Spouse name: Not on file   Number of children: Not on file   Years of education: Not on file   Highest education level: Not on file  Occupational History   Occupation: Physician    Comment: Neurologist  Tobacco Use   Smoking status: Never   Smokeless tobacco: Never  Vaping Use   Vaping Use: Never used  Substance and Sexual Activity   Alcohol use: Yes    Alcohol/week: 6.0 standard drinks    Types: 1 Glasses of wine, 5 Standard drinks or equivalent per week   Drug use: No   Sexual activity: Yes    Partners: Female  Other Topics Concern   Not on file  Social History Narrative   Not on file   Social Determinants of Health   Financial Resource Strain: Not on file  Food Insecurity: Not on file  Transportation Needs: Not on file  Physical Activity: Not on file  Stress: Not on file  Social Connections: Not on file     Family History:  The patient's  family history  includes Aneurysm in his father.   ROS:   Please see the history of present illness.    ROS All other systems reviewed and are negative.   PHYSICAL EXAM:   VS:  BP 126/80   Pulse 75   Ht '5\' 9"'$  (1.753 m)   Wt 179 lb 9.6 oz (81.5 kg)   SpO2 98%   BMI 26.52 kg/m   Physical Exam  GEN: Well nourished, well developed, in no acute distress  Neck: no JVD, carotid bruits, or masses Cardiac:RRR; no murmurs, rubs, or gallops  Respiratory:  clear to auscultation bilaterally, normal work of breathing GI: soft, nontender, nondistended, + BS Ext: Right arm at cath site without hematoma or hemorrhage, good  radial brachial pulses, lower extremities without cyanosis, clubbing, or edema, Good distal pulses bilaterally Neuro:  Alert and Oriented x 3 Psych: euthymic mood, full affect  Wt Readings from Last 3 Encounters:  02/15/21 179 lb 9.6 oz (81.5 kg)  02/07/21 185 lb 3 oz (84 kg)  02/02/21 185 lb (83.9 kg)      Studies/Labs Reviewed:   EKG:  EKG is not  ordered today.     Recent Labs: 02/07/2021: ALT 18; Magnesium 2.2; TSH 0.770 02/08/2021: BUN 14; Creatinine, Ser 1.35; Hemoglobin 9.0; Platelets 206; Potassium 3.2; Sodium 138   Lipid Panel    Component Value Date/Time   CHOL 144 01/09/2021 1018   TRIG 60 01/09/2021 1018   HDL 31 (L) 01/09/2021 1018   CHOLHDL 4.6 01/09/2021 1018   VLDL 12 01/09/2021 1018   LDLCALC 101 (H) 01/09/2021 1018    Additional studies/ records that were reviewed today include:  Echo 02/08/21 IMPRESSIONS     1. The left ventricular apex and RV are heavily trabeculated. Consider  cardiac MRI to assess for ventricular noncompaction. Left ventricular  ejection fraction, by estimation, is 60 to 65%. The left ventricle has  normal function. The left ventricle has  no regional wall motion abnormalities. There is mild concentric left  ventricular hypertrophy. Left ventricular diastolic parameters are  consistent with Grade II diastolic dysfunction  (pseudonormalization). The  average left ventricular global longitudinal   strain is -19.2 %. The global longitudinal strain is normal.   2. Right ventricular systolic function is normal. The right ventricular  size is normal. There is normal pulmonary artery systolic pressure.   3. Left atrial size was severely dilated.   4. Right atrial size was moderately dilated.   5. The mitral valve is normal in structure. Trivial mitral valve  regurgitation. No evidence of mitral stenosis.   6. The aortic valve is tricuspid. Aortic valve regurgitation is not  visualized. No aortic stenosis is present.   7. The inferior vena cava is normal in size with greater than 50%  respiratory variability, suggesting right atrial pressure of 3 mmHg.   Cath 02/07/21   Minimal to mild disease in the mid RCA and mild diffuse disease in the LAD with tapering after diagonal branch.  No significant lesion.   The left ventricular systolic function is normal.  The left ventricular ejection fraction is 55-65% by visual estimate.   LV end diastolic pressure is normal.   There is no aortic valve stenosis.   SUMMARY: Angiographically minimal CAD-stable from 2013 Suspect nonmicrovascular/anginal chest pain Normal LVEF and EDP.     RECOMMENDATIONS Monitor overnight on telemetry to ensure that these episodes of chest discomfort not related to any type of arrhythmia.  Otherwise anticipate discharge tomorrow. He does have a 2D echocardiogram ordered which can be completed.  NST 01/26/21   The study is normal. The study is low risk. Occasional PVCs with exercise.   No ST deviation was noted.   There is a small mild intensity fixed inferior defect with normal wall motion. Finding is consistent with artifact secondary to subdiaphragmatic attenuation.   Left ventricular function is normal(57%). End diastolic cavity size is mildly enlarged.      Risk Assessment/Calculations:         ASSESSMENT:    1. Preoperative  clearance   2. Chest pain, unspecified type   3. Abnormal trabeculation of left ventricular myocardium (HCC)   4. PVC (premature ventricular contraction)   5. Chronic hypokalemia   6.  Thalassemia minor   7. OSA on CPAP   8. Stage 2 chronic kidney disease      PLAN:  In order of problems listed above:  Preop clearance for circumcision by Dr. Milford Cage with [facility].  Discussed with Dr. Domenic Polite who felt the patient could proceed while awaiting MRI.  At this point patient wants to hold off until after his MRI is back.  Chest pain with minimal CAD on cardiac cath 02/07/2021.  Chest pain into his back and last for hours worse in the evening.  He is wondering if he could have subclinical malaria since he had a lot of body aches as well and was in [place] 2 [place] in July.  He will asked Dr. Gerarda Fraction to do work-up.  LV apex and RV heavily trabeculated on echo- MRI ordered to rule out ventricular noncompaction LVEF 60 to 65% grade 2 DD  PVCs quite symptomatic wearing a 3-day monitor-he will mail back today.  Chronic hypokalemia-Per patient potassium was 4.0 on blood work yesterday.  Continue current treatment.  Chronic anemia with thalassemia minor  OSA on CPAP  CKD stage II-labs done yesterday but I do not have the results.  Shared Decision Making/Informed Consent        Medication Adjustments/Labs and Tests Ordered: Current medicines are reviewed at length with the patient today.  Concerns regarding medicines are outlined above.  Medication changes, Labs and Tests ordered today are listed in the Patient Instructions below. Patient Instructions  Medication Instructions:  Your physician recommends that you continue on your current medications as directed. Please refer to the Current Medication list given to you today.  *If you need a refill on your cardiac medications before your next appointment, please call your pharmacy*   Lab Work: NONE   If you have labs (blood work)  drawn today and your tests are completely normal, you will receive your results only by: [place] (if you have MyChart) OR A paper copy in the mail If you have any lab test that is abnormal or we need to change your treatment, we will call you to review the results.   Testing/Procedures: NONE   Follow-Up: At [facility], you and your health needs are our priority.  As part of our continuing mission to provide you with exceptional heart care, we have created designated Provider Care Teams.  These Care Teams include your primary Cardiologist (physician) and Advanced Practice Providers (APPs -  Physician Assistants and Nurse Practitioners) who all work together to provide you with the care you need, when you need it.  We recommend signing up for the patient portal called "MyChart".  Sign up information is provided on this After Visit Summary.  MyChart is used to connect with patients for Virtual Visits (Telemedicine).  Patients are able to view lab/test results, encounter notes, upcoming appointments, etc.  Non-urgent messages can be sent to your provider as well.   To learn more about what you can do with MyChart, go to [email or web address].    Your next appointment:    After MRI   The format for your next appointment:   In Person  Provider:   Rozann Lesches, MD   Other Instructions Thank you for choosing [facility]!     Sumner Boast, PA-C  02/15/2021 12:16 PM    [facility] [address], [place], [place]  [address] Phone: [phone]; Fax: [phone]

## 2021-02-14 ENCOUNTER — Telehealth: Payer: Self-pay

## 2021-02-14 ENCOUNTER — Ambulatory Visit (HOSPITAL_BASED_OUTPATIENT_CLINIC_OR_DEPARTMENT_OTHER): Admission: RE | Admit: 2021-02-14 | Payer: 59 | Source: Home / Self Care | Admitting: Urology

## 2021-02-14 SURGERY — CIRCUMCISION, ADULT
Anesthesia: General

## 2021-02-14 NOTE — Telephone Encounter (Signed)
[facility] Pre-operative Risk Assessment    Patient Name: Devin Wilson  DOB: [birth year] MRN: [id number]  HEARTCARE STAFF:  - IMPORTANT!!!!!! Under Visit Info/Reason for Call, type in Other and utilize the format Clearance MM/DD/YY or Clearance TBD. Do not use dashes or single digits. - Please review there is not already an duplicate clearance open for this procedure. - If request is for dental extraction, please clarify the # of teeth to be extracted. - If the patient is currently at the dentist's office, call Pre-Op Callback Staff (MA/nurse) to input urgent request.  - If the patient is not currently in the dentist office, please route to the Pre-Op pool.  Request for surgical clearance:  What type of surgery is being performed? Circumcision  When is this surgery scheduled? TBD  What type of clearance is required (medical clearance vs. Pharmacy clearance to hold med vs. Both)? both  Are there any medications that need to be held prior to surgery and how long? Please advise -would like blood thinner /aspirin held for 5 days   Practice name and name of physician performing surgery? [facility]  What is the office phone number? [phone]     7.   What is the office fax number? [phone]  8.   Anesthesia type (None, local, MAC, general) ? Not specified    Jannet Askew 02/14/2021, 1:51 PM  _________________________________________________________________   (provider comments below)

## 2021-02-15 ENCOUNTER — Other Ambulatory Visit: Payer: Self-pay

## 2021-02-15 ENCOUNTER — Ambulatory Visit (INDEPENDENT_AMBULATORY_CARE_PROVIDER_SITE_OTHER): Payer: 59 | Admitting: Physician Assistant

## 2021-02-15 ENCOUNTER — Encounter: Payer: Self-pay | Admitting: Physician Assistant

## 2021-02-15 VITALS — BP 126/80 | HR 75 | Ht 69.0 in | Wt 179.6 lb

## 2021-02-15 DIAGNOSIS — D563 Thalassemia minor: Secondary | ICD-10-CM

## 2021-02-15 DIAGNOSIS — I428 Other cardiomyopathies: Secondary | ICD-10-CM

## 2021-02-15 DIAGNOSIS — Z9989 Dependence on other enabling machines and devices: Secondary | ICD-10-CM

## 2021-02-15 DIAGNOSIS — E876 Hypokalemia: Secondary | ICD-10-CM

## 2021-02-15 DIAGNOSIS — Z01818 Encounter for other preprocedural examination: Secondary | ICD-10-CM

## 2021-02-15 DIAGNOSIS — N182 Chronic kidney disease, stage 2 (mild): Secondary | ICD-10-CM

## 2021-02-15 DIAGNOSIS — R079 Chest pain, unspecified: Secondary | ICD-10-CM | POA: Diagnosis not present

## 2021-02-15 DIAGNOSIS — G4733 Obstructive sleep apnea (adult) (pediatric): Secondary | ICD-10-CM

## 2021-02-15 DIAGNOSIS — I493 Ventricular premature depolarization: Secondary | ICD-10-CM | POA: Diagnosis not present

## 2021-02-15 NOTE — Telephone Encounter (Addendum)
   Patient Name: Devin Wilson  DOB: Apr 01, 1966 MRN: [id number]  Primary Cardiologist: Rozann Lesches, MD  Chart reviewed as part of pre-operative protocol coverage. Patient has post-hospital office visit scheduled today with [name] PA-C. I reached out to her to request that she address clearance in that OV. It was noted in recent discharge that he may require further workup to evaluate for amyloidosis. He is also no longer on ASA per last discharge note. Further recommendations will follow pending today's visit with APP. Will route to surgeon via [place] fax function so they are aware. Otherwise will remove this clearance from the preop box since additional APP input not needed at this time.  Charlie Pitter, PA-C 02/15/2021, 9:17 AM

## 2021-02-15 NOTE — Patient Instructions (Signed)
Medication Instructions:  Your physician recommends that you continue on your current medications as directed. Please refer to the Current Medication list given to you today.  *If you need a refill on your cardiac medications before your next appointment, please call your pharmacy*   Lab Work: NONE   If you have labs (blood work) drawn today and your tests are completely normal, you will receive your results only by: [place] (if you have MyChart) OR A paper copy in the mail If you have any lab test that is abnormal or we need to change your treatment, we will call you to review the results.   Testing/Procedures: NONE   Follow-Up: At [facility], you and your health needs are our priority.  As part of our continuing mission to provide you with exceptional heart care, we have created designated Provider Care Teams.  These Care Teams include your primary Cardiologist (physician) and Advanced Practice Providers (APPs -  Physician Assistants and Nurse Practitioners) who all work together to provide you with the care you need, when you need it.  We recommend signing up for the patient portal called "MyChart".  Sign up information is provided on this After Visit Summary.  MyChart is used to connect with patients for Virtual Visits (Telemedicine).  Patients are able to view lab/test results, encounter notes, upcoming appointments, etc.  Non-urgent messages can be sent to your provider as well.   To learn more about what you can do with MyChart, go to [email or web address].    Your next appointment:    After MRI   The format for your next appointment:   In Person  Provider:   Rozann Lesches, MD   Other Instructions Thank you for choosing [facility]!

## 2021-03-02 ENCOUNTER — Encounter (HOSPITAL_COMMUNITY): Payer: Self-pay

## 2021-03-02 ENCOUNTER — Encounter (HOSPITAL_COMMUNITY)
Admission: RE | Admit: 2021-03-02 | Discharge: 2021-03-02 | Disposition: A | Discharge status: Home / Self Care | Payer: 59 | Source: Ambulatory Visit | Attending: Nephrology | Admitting: Nephrology

## 2021-03-02 DIAGNOSIS — D631 Anemia in chronic kidney disease: Secondary | ICD-10-CM | POA: Diagnosis not present

## 2021-03-02 DIAGNOSIS — N189 Chronic kidney disease, unspecified: Secondary | ICD-10-CM | POA: Insufficient documentation

## 2021-03-02 MED ORDER — SODIUM CHLORIDE 0.9 % IV SOLN
Freq: Once | INTRAVENOUS | Status: AC
  Administered 2021-03-02: 250 mL via INTRAVENOUS

## 2021-03-02 MED ORDER — SODIUM CHLORIDE 0.9 % IV SOLN
510.0000 mg | Freq: Once | INTRAVENOUS | Status: AC
  Administered 2021-03-02: 510 mg via INTRAVENOUS
  Filled 2021-03-02: qty 17

## 2021-03-06 ENCOUNTER — Ambulatory Visit: Payer: 59 | Admitting: Physician Assistant

## 2021-03-09 ENCOUNTER — Other Ambulatory Visit: Payer: Self-pay

## 2021-03-09 ENCOUNTER — Encounter (HOSPITAL_COMMUNITY)
Admission: RE | Admit: 2021-03-09 | Discharge: 2021-03-09 | Disposition: A | Discharge status: Home / Self Care | Payer: 59 | Source: Ambulatory Visit | Attending: Nephrology | Admitting: Nephrology

## 2021-03-09 DIAGNOSIS — N189 Chronic kidney disease, unspecified: Secondary | ICD-10-CM | POA: Diagnosis not present

## 2021-03-09 MED ORDER — SODIUM CHLORIDE 0.9 % IV SOLN: INTRAVENOUS | Status: DC

## 2021-03-09 MED ORDER — SODIUM CHLORIDE 0.9 % IV SOLN
510.0000 mg | Freq: Once | INTRAVENOUS | Status: AC
  Administered 2021-03-09: 510 mg via INTRAVENOUS
  Filled 2021-03-09: qty 510

## 2021-03-10 ENCOUNTER — Other Ambulatory Visit: Payer: Self-pay | Admitting: Nephrology

## 2021-03-10 DIAGNOSIS — E269 Hyperaldosteronism, unspecified: Secondary | ICD-10-CM

## 2021-03-20 ENCOUNTER — Other Ambulatory Visit (INDEPENDENT_AMBULATORY_CARE_PROVIDER_SITE_OTHER): Payer: Self-pay

## 2021-03-20 DIAGNOSIS — D649 Anemia, unspecified: Secondary | ICD-10-CM

## 2021-03-24 ENCOUNTER — Ambulatory Visit
Admission: RE | Admit: 2021-03-24 | Discharge: 2021-03-24 | Disposition: A | Discharge status: Home / Self Care | Payer: 59 | Source: Ambulatory Visit | Attending: Nephrology | Admitting: Nephrology

## 2021-03-24 DIAGNOSIS — E269 Hyperaldosteronism, unspecified: Secondary | ICD-10-CM

## 2021-03-28 ENCOUNTER — Encounter: Payer: Self-pay | Admitting: Neurology

## 2021-03-28 ENCOUNTER — Ambulatory Visit (INDEPENDENT_AMBULATORY_CARE_PROVIDER_SITE_OTHER): Payer: 59 | Admitting: Neurology

## 2021-03-28 VITALS — BP 125/76 | HR 66 | Ht 69.0 in | Wt 179.5 lb

## 2021-03-28 DIAGNOSIS — R200 Anesthesia of skin: Secondary | ICD-10-CM

## 2021-03-28 DIAGNOSIS — R292 Abnormal reflex: Secondary | ICD-10-CM

## 2021-03-28 NOTE — Progress Notes (Signed)
[facility]  PATIENT: Devin Wilson DOB: 1966-03-18  REFERRING DOCTOR OR PCP: Redmond School, MD SOURCE: Patient  _________________________________   HISTORICAL  CHIEF COMPLAINT:  Chief Complaint  Patient presents with   New Patient (Initial Visit)    RM 1, alone.    HISTORY OF PRESENT ILLNESS:  I had the pleasure of seeing your patient, Devin Wilson, at [facility] for neurologic consultation regarding his right-sided numbness.  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  Symptoms were more significant for the first few days and then improved but have since worsened again.  A couple days after the hand numbness he starrted to have numbness in his right foot and noted mild numbness in the lower trunk.   The numbness is most intense in the left index finge and has been more constant there.   The big toe is also more continuously numb.   He denies weakness.  He has had more numbness over the past several days.  Symptoms fluctuate but there is always some numbness in the index finger and great toe.  Symptoms do not wake him up at night.  The numbness is sometimes worse in the evenings.  He denies neck pain.    He feels bladder function is stable.  He has an MRI of the brain and cervical spine scheduled on Friday.      He had pain in both hands 10 years ago and NCVEMG was normal at that time.     REVIEW OF SYSTEMS: Constitutional: No fevers, chills, sweats, or change in appetite Eyes: No visual changes, double vision, eye pain Ear, nose and throat: No hearing loss, ear pain, nasal congestion, sore throat Cardiovascular: He has had angina.  He has diastolic congestive heart failure. Respiratory:  No shortness of breath at rest or with exertion.   No wheezes GastrointestinaI: No nausea, vomiting, diarrhea, abdominal pain, fecal incontinence Genitourinary:  No dysuria, urinary  retention or frequency.  No nocturia. Musculoskeletal:  No neck pain, back pain Integumentary: No rash, pruritus, skin lesions Neurological: as above Psychiatric: No depression at this time.  No anxiety Endocrine: No palpitations, diaphoresis, change in appetite, change in weigh or increased thirst Hematologic/Lymphatic:  No anemia, purpura, petechiae. Allergic/Immunologic: No itchy/runny eyes, nasal congestion, recent allergic reactions, rashes  ALLERGIES: Allergies  Allergen Reactions   Ciprofloxacin Swelling    All flouroquinolones/Facial swelling   Levaquin [Levofloxacin In D5w]     HOME MEDICATIONS:  Current Outpatient Medications:    acyclovir (ZOVIRAX) 800 MG tablet, TAKE 1 TABLET EVERY DAY, Disp: 31 tablet, Rfl: 12   amLODipine (NORVASC) 10 MG tablet, Take 10 mg by mouth at bedtime. , Disp: , Rfl:    cloNIDine (CATAPRES) 0.1 MG tablet, Take 0.2 mg by mouth daily., Disp: , Rfl:    Docusate Sodium 100 MG capsule, Take 200 mg by mouth at bedtime. Takes 2 tablets at bedtime., Disp: , Rfl:    EDARBI 80 MG TABS, Take 1 tablet by mouth daily., Disp: , Rfl:    ferrous sulfate 325 (65 FE) MG tablet, Take 325 mg by mouth daily with breakfast., Disp: , Rfl:    labetalol (NORMODYNE) 300 MG tablet, Take 900 mg by mouth 2 (two) times daily., Disp: , Rfl:    Multiple Vitamin (MULTIVITAMIN WITH MINERALS) TABS tablet, Take 1 tablet by mouth daily., Disp: , Rfl:    Omega-3 Fatty Acids (OMEGA 3 500 PO), Take  by mouth daily., Disp: , Rfl:    pantoprazole (PROTONIX) 40 MG tablet, Take 1 tablet (40 mg total) by mouth daily before breakfast., Disp: 30 tablet, Rfl: 1   Potassium Chloride ER 20 MEQ TBCR, Take 1 tablet by mouth 2 (two) times daily., Disp: , Rfl:    spironolactone (ALDACTONE) 25 MG tablet, Take 0.5 tablets (12.5 mg total) by mouth daily. (Patient taking differently: Take 25 mg by mouth daily.), Disp: 45 tablet, Rfl: 3   tadalafil (CIALIS) 5 MG tablet, Take 5 mg by mouth as needed. ,  Disp: , Rfl:   PAST MEDICAL HISTORY: Past Medical History:  Diagnosis Date   CKD (chronic kidney disease) stage 3, GFR 30-59 ml/min (HCC)    GERD (gastroesophageal reflux disease)    Hyperlipidemia    Hypertension    Hypokalemia    Migraines    Sleep apnea    auto set-CPAP   Thalassemia minor     PAST SURGICAL HISTORY: Past Surgical History:  Procedure Laterality Date   BIOPSY  02/02/2021   Procedure: BIOPSY;  Surgeon: [name], MD;  Location: [facility];  Service: Endoscopy;;   COLONOSCOPY  2006   DevinRourk @ [facility]   ESOPHAGOGASTRODUODENOSCOPY (EGD) WITH PROPOFOL N/A 02/02/2021   Procedure: ESOPHAGOGASTRODUODENOSCOPY (EGD) WITH PROPOFOL;  Surgeon: [name], MD;  Location: [facility];  Service: Endoscopy;  Laterality: N/A;  [place]     "in infancy; umbilical"   KNEE ARTHROSCOPY WITH MEDIAL MENISECTOMY Left 03/24/2018   Procedure: KNEE ARTHROSCOPY WITH MEDIAL MENISECTOMY,LATERAL MENISECTOMY, CHONDROPLASTY MEDIAL FEMUR;  Surgeon: [name], MD;  Location: [facility];  Service: Orthopedics;  Laterality: Left;   LEFT HEART CATH AND CORONARY ANGIOGRAPHY N/A 02/07/2021   Procedure: LEFT HEART CATH AND CORONARY ANGIOGRAPHY;  Surgeon: [name], MD;  Location: [place] [facility];  Service: Cardiovascular;  Laterality: N/A;   LEFT HEART CATHETERIZATION WITH CORONARY ANGIOGRAM N/A 08/10/2011   Procedure: LEFT HEART CATHETERIZATION WITH CORONARY ANGIOGRAM;  Surgeon: [name], MD;  Location: [facility] C[facility];  Service: Cardiovascular;  Laterality: N/A;   VASECTOMY     WISDOM TOOTH EXTRACTION      FAMILY HISTORY: Family History  Problem Relation Age of Onset   Aneurysm Father    Colon polyps Neg Hx    Colon cancer Neg Hx    Esophageal cancer Neg Hx    Rectal cancer Neg Hx    Stomach cancer Neg Hx     SOCIAL HISTORY:  Social History   Socioeconomic History   Marital status: Married    Spouse name: Not on file   Number of  children: Not on file   Years of education: Not on file   Highest education level: Not on file  Occupational History   Occupation: Physician    Comment: Neurologist  Tobacco Use   Smoking status: Never   Smokeless tobacco: Never  Vaping Use   Vaping Use: Never used  Substance and Sexual Activity   Alcohol use: Yes    Alcohol/week: 6.0 standard drinks    Types: 1 Glasses of wine, 5 Standard drinks or equivalent per week   Drug use: No   Sexual activity: Yes    Partners: Female  Other Topics Concern   Not on file  Social History Narrative   Not on file   Social Determinants of Health   Financial Resource Strain: Not on file  Food Insecurity: Not on file  Transportation Needs: Not  on file  Physical Activity: Not on file  Stress: Not on file  Social Connections: Not on file  Intimate Partner Violence: Not on file     PHYSICAL EXAM  Vitals:   03/28/21 1151  BP: 125/76  Pulse: 66  SpO2: 99%  Weight: 179 lb 8 oz (81.4 kg)  Height: 5\' 9"  (1.753 m)    Body mass index is 26.51 kg/m.   General: The patient is well-developed and well-nourished and in no acute distress  HEENT:  Head is Benjamin Perez/AT.  Sclera are anicteric.    Neck: No carotid bruits are noted.  The neck is nontender.  Good range of motion.  Cardiovascular: The heart has a regular rate and rhythm with a normal S1 and S2. There were no murmurs, gallops or rubs.    Skin: Extremities are without rash or edema.  Musculoskeletal:  Back is nontender  Neurologic Exam  Mental status: The patient is alert and oriented x 3 at the time of the examination. The patient has apparent normal recent and remote memory, with an apparently normal attention span and concentration ability.   Speech is normal.  Cranial nerves: Extraocular movements are full.  Facial strength and sensation was normal.  Hearing was normal.  Motor:  Muscle bulk is normal.   Tone is normal. Strength is 4++/5 in the right biceps and 5 / 5 elsewhere  in the extremities  Sensory: Mild Tinel's sign on the right.  Sensory testing is intact to pinprick, soft touch and vibration sensation in all 4 extremities.  Coordination: Cerebellar testing reveals good finger-nose-finger and heel-to-shin bilaterally.  Gait and station: Station is normal.   Gait is normal. Tandem gait is normal. Romberg is negative.   Reflexes: Deep tendon reflexes are symmetric and 3 in the arms, 3+ in the legs.  There is spread at the knees.  No ankle clonus.   Plantar responses are flexor.    DIAGNOSTIC DATA (LABS, IMAGING, TESTING) - I reviewed patient records, labs, notes, testing and imaging myself where available.  Lab Results  Component Value Date   WBC 4.4 02/08/2021   HGB 9.0 (L) 02/08/2021   HCT 28.2 (L) 02/08/2021   MCV 73.4 (L) 02/08/2021   PLT 206 02/08/2021      Component Value Date/Time   NA 138 02/08/2021 0300   NA 140 11/29/2017 0000   K 3.2 (L) 02/08/2021 0300   CL 104 02/08/2021 0300   CO2 28 02/08/2021 0300   GLUCOSE 95 02/08/2021 0300   BUN 14 02/08/2021 0300   BUN 15 11/29/2017 0000   CREATININE 1.35 (H) 02/08/2021 0300   CREATININE 1.59 (H) 01/16/2018 0848   CALCIUM 8.7 (L) 02/08/2021 0300   PROT 6.2 (L) 02/07/2021 2050   ALBUMIN 3.0 (L) 02/07/2021 2050   AST 20 02/07/2021 2050   ALT 18 02/07/2021 2050   ALKPHOS 89 02/07/2021 2050   BILITOT 1.1 02/07/2021 2050   GFRNONAA >60 02/08/2021 0300   GFRNONAA 49 (L) 01/16/2018 0848   GFRAA >60 03/20/2018 0930   GFRAA 57 (L) 01/16/2018 0848   Lab Results  Component Value Date   CHOL 144 01/09/2021   HDL 31 (L) 01/09/2021   LDLCALC 101 (H) 01/09/2021   TRIG 60 01/09/2021   CHOLHDL 4.6 01/09/2021   Lab Results  Component Value Date   HGBA1C 5.0 02/07/2021   No results found for: [id number] Lab Results  Component Value Date   TSH 0.770 02/07/2021       ASSESSMENT AND  PLAN  Right sided numbness  Hyperreflexia   In summary, Devin Wilson is a 55 year old man with a  3-week history of right-sided numbness.  On examination, he has very mild right arm weakness and hyperreflexia in the legs, right slightly more than left.  Due to his symptoms and hyperreflexia, I am most concerned about a cervical spine process.  He already has an MRI of the brain and cervical spine scheduled for later this week.  This will allow [place] to rule out an intrinsic or extrinsic cervical myelopathy or other CNS process.  He will ask the imaging center team send [place] the images on CD so that I can personally review them.  The tingling is not uncomfortable enough to begin a medication like gabapentin but I would consider doing so if symptoms worsen.  If the MRI of the brain and cervical spine are essentially normal, we would need to consider a nerve conduction study/EMG to further evaluate   Maryfer Tauzin A. Felecia Shelling, MD, [facility] 76/11/108, 0:34 PM Certified in Neurology, Clinical Neurophysiology, Sleep Medicine and Neuroimaging  [facility] [address] [place], [place] [address] [phone]

## 2021-04-05 ENCOUNTER — Telehealth: Payer: Self-pay | Admitting: Neurology

## 2021-04-05 ENCOUNTER — Telehealth (HOSPITAL_COMMUNITY): Payer: Self-pay | Admitting: *Deleted

## 2021-04-05 NOTE — Telephone Encounter (Signed)
I personally reviewed the MRI of the brain and cervical spine performed 03/31/2021.  MRI of the brain is normal for age.  There is 1 single subcortical T2 hyperintense focus in the left frontal insular, normal for age.  The MRI of the cervical spine shows the following: Decreased signal within the vertebral bodies consistent with increased cellularity (likely due to beta thalassemia) Mild uncovertebral spurring at C3-C4 and C4-C5. Mild disc protrusion at C5-C6 but no nerve root compression or significant spinal stenosis. Disc bulging at C6-C7 but no nerve root compression or spinal stenosis. On sagittal STIR images only and not on any other sequence there appeared to be a small hyperintense focus adjacent to C5-C6 (image #7 only).   Unclear if this is artifact or an actual finding.

## 2021-04-05 NOTE — Telephone Encounter (Signed)
Reaching out to patient to offer assistance regarding upcoming cardiac imaging study; pt verbalizes understanding of appt date/time, parking situation and where to check in, and verified current allergies; name and call back number provided for further questions should they arise  Razi Hickle RN Navigator Cardiac Imaging [facility] [phone] office [phone] cell  

## 2021-04-06 ENCOUNTER — Other Ambulatory Visit: Payer: Self-pay

## 2021-04-06 ENCOUNTER — Ambulatory Visit (HOSPITAL_COMMUNITY)
Admission: RE | Admit: 2021-04-06 | Discharge: 2021-04-06 | Disposition: A | Discharge status: Home / Self Care | Payer: 59 | Source: Ambulatory Visit | Attending: Cardiology | Admitting: Cardiology

## 2021-04-06 DIAGNOSIS — I428 Other cardiomyopathies: Secondary | ICD-10-CM

## 2021-04-06 NOTE — Telephone Encounter (Signed)
I spoke to [name] about these results.  If symptoms worsen we will check an NCV/EMG study and/or repeat the MRI of the cervical spine to further evaluate

## 2021-04-07 ENCOUNTER — Telehealth: Payer: Self-pay

## 2021-04-07 ENCOUNTER — Telehealth (HOSPITAL_COMMUNITY): Payer: Self-pay

## 2021-04-07 DIAGNOSIS — I428 Other cardiomyopathies: Secondary | ICD-10-CM

## 2021-04-07 NOTE — Telephone Encounter (Signed)
-----   Message from Satira Sark, MD sent at 04/07/2021  9:26 AM EST ----- Results reviewed.  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  LVEF normal at 66%.  I would recommend that we have [name] see Dr. Aundra Dubin in the heart failure clinic to establish follow-up of LV noncompaction and ensure that we have him on the right regimen and develop a follow-up plan.  He has history of frequent PVCs but no significant CAD by recent evaluation.

## 2021-04-07 NOTE — Telephone Encounter (Signed)
Arbutus Leas, can you schedule this patient for an appointment with Dr. Aundra Dubin? See below.

## 2021-04-07 NOTE — Telephone Encounter (Signed)
-----   Message from Larey Dresser, MD sent at 04/07/2021 10:08 AM EST ----- Can we get this patient in with me? 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  ----- Message ----- From: Satira Sark, MD Sent: 04/07/2021   9:26 AM EST To: Larey Dresser, MD, Bernita Raisin, RN  Results reviewed.  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  LVEF normal at 66%.  I would recommend that we have [name] see Dr. Aundra Dubin in the heart failure clinic to establish follow-up of LV noncompaction and ensure that we have him on the right regimen and develop a follow-up plan.  He has history of frequent PVCs but no significant CAD by recent evaluation.

## 2021-04-07 NOTE — Telephone Encounter (Signed)
Left message to return call 

## 2021-04-11 NOTE — Telephone Encounter (Signed)
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 He has apt in 2 days 11/17 with B.Strader, PA-C to discuss test results.

## 2021-04-13 ENCOUNTER — Encounter: Payer: Self-pay | Admitting: Student

## 2021-04-13 ENCOUNTER — Ambulatory Visit (INDEPENDENT_AMBULATORY_CARE_PROVIDER_SITE_OTHER): Payer: 59 | Admitting: Student

## 2021-04-13 ENCOUNTER — Other Ambulatory Visit: Payer: Self-pay

## 2021-04-13 VITALS — BP 122/74 | HR 68 | Ht 69.0 in | Wt 179.0 lb

## 2021-04-13 DIAGNOSIS — I428 Other cardiomyopathies: Secondary | ICD-10-CM | POA: Diagnosis not present

## 2021-04-13 DIAGNOSIS — Z87898 Personal history of other specified conditions: Secondary | ICD-10-CM

## 2021-04-13 DIAGNOSIS — G4733 Obstructive sleep apnea (adult) (pediatric): Secondary | ICD-10-CM

## 2021-04-13 DIAGNOSIS — D563 Thalassemia minor: Secondary | ICD-10-CM

## 2021-04-13 DIAGNOSIS — Z79899 Other long term (current) drug therapy: Secondary | ICD-10-CM | POA: Diagnosis not present

## 2021-04-13 DIAGNOSIS — Z9989 Dependence on other enabling machines and devices: Secondary | ICD-10-CM

## 2021-04-13 DIAGNOSIS — I10 Essential (primary) hypertension: Secondary | ICD-10-CM

## 2021-04-13 NOTE — Patient Instructions (Signed)
Medication Instructions:  Your physician recommends that you continue on your current medications as directed. Please refer to the Current Medication list given to you today.  *If you need a refill on your cardiac medications before your next appointment, please call your pharmacy*   Lab Work: Your physician recommends that you return for lab work in: Environmental manager)   If you have labs (blood work) drawn today and your tests are completely normal, you will receive your results only by: [employer] (if you have MyChart) OR A paper copy in the mail If you have any lab test that is abnormal or we need to change your treatment, we will call you to review the results.   Testing/Procedures: NONE    Follow-Up: At [facility], you and your health needs are our priority.  As part of our continuing mission to provide you with exceptional heart care, we have created designated Provider Care Teams.  These Care Teams include your primary Cardiologist (physician) and Advanced Practice Providers (APPs -  Physician Assistants and Nurse Practitioners) who all work together to provide you with the care you need, when you need it.  We recommend signing up for the patient portal called "MyChart".  Sign up information is provided on this After Visit Summary.  MyChart is used to connect with patients for Virtual Visits (Telemedicine).  Patients are able to view lab/test results, encounter notes, upcoming appointments, etc.  Non-urgent messages can be sent to your provider as well.   To learn more about what you can do with MyChart, go to [email or web address].    Your next appointment:   6 month(s)  The format for your next appointment:   In Person  Provider:   Rozann Lesches, MD    Other Instructions Thank you for choosing [facility]!

## 2021-04-13 NOTE — Progress Notes (Signed)
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   He was last examined by [name], PA-C in 01/2021 following his recent hospitalization for chest pain during which repeat catheterization showed minimal CAD as outlined above. His echocardiogram showed a preserved EF of 60 to 65% but he did have heavy trabeculation along the LV apex and RV with cardiac MRI recommended to assess for noncompaction. He was also noted to have mild LVH, grade 2 diastolic dysfunction and trivial MR. At the time of follow-up, he still reported associated chest discomfort typically when having PVC's. It was recommended he wear a Holter monitor to assess PVC burden and also have a cardiac MRI as previously recommended during his admission. His monitor showed predominantly normal sinus rhythm with an average heart rate of 80 bpm and he did have rare PAC's representing less than 1% of total beats and occasional PVCs representing less than 1.3% of total beats. His cardiac MRI was performed on 04/06/2021 and showed normal LV and RV function with EF 66% but he did have evidence of noncompaction in the inferior lateral and lateral wall of the myocardium and Dr. Domenic Polite recommended he follow-up with Advanced Heart Failure.   In talking with the patient today, he reports having worsening palpitations and fatigue a few months prior but his hemoglobin had dropped to almost 7  at that time and Nephrology arranged for a transfusion. After receiving this, his Hgb improved to 9.5 and he experienced improvement in his symptoms. He does use his CPAP at night with no reported orthopnea or PND. He does experience intermittent lower extremity edema which has been variable over the past several months. Watches his sodium intake. He was previously taking [place] but this was switched to Lisinopril recently given decreased availability at the pharmacy.   Past Medical History:  Diagnosis Date   CKD (chronic kidney disease) stage 3, GFR 30-59 ml/min (HCC)    GERD (gastroesophageal reflux disease)    Hyperlipidemia    Hypertension    Hypokalemia    Migraines    Sleep apnea    auto set-CPAP   Thalassemia minor     Past Surgical History:  Procedure Laterality Date   BIOPSY  02/02/2021   Procedure: BIOPSY;  Surgeon: [name], MD;  Location: [facility];  Service: Endoscopy;;   COLONOSCOPY  2006   Dr.Rourk @ [facility]   ESOPHAGOGASTRODUODENOSCOPY (EGD) WITH PROPOFOL N/A 02/02/2021   Procedure: ESOPHAGOGASTRODUODENOSCOPY (EGD) WITH PROPOFOL;  Surgeon: [name], MD;  Location: [facility];  Service: Endoscopy;  Laterality: N/A;  [place]     "in infancy; umbilical"   KNEE ARTHROSCOPY WITH MEDIAL MENISECTOMY Left 03/24/2018   Procedure: KNEE ARTHROSCOPY WITH MEDIAL MENISECTOMY,LATERAL MENISECTOMY, CHONDROPLASTY MEDIAL FEMUR;  Surgeon: [name], MD;  Location: [facility];  Service: Orthopedics;  Laterality: Left;   LEFT HEART  CATH AND CORONARY ANGIOGRAPHY N/A 02/07/2021   Procedure: LEFT HEART CATH AND CORONARY ANGIOGRAPHY;  Surgeon: [name], MD;  Location: [facility];  Service: Cardiovascular;  Laterality: N/A;   LEFT HEART CATHETERIZATION WITH CORONARY ANGIOGRAM N/A 08/10/2011   Procedure: LEFT HEART CATHETERIZATION WITH CORONARY ANGIOGRAM;  Surgeon: [name], MD;  Location: [facility] CATH LAB;  Service: Cardiovascular;   Laterality: N/A;   VASECTOMY     WISDOM TOOTH EXTRACTION      Current Medications: Outpatient Medications Prior to Visit  Medication Sig Dispense Refill   acyclovir (ZOVIRAX) 800 MG tablet TAKE 1 TABLET EVERY DAY 31 tablet 12   amLODipine (NORVASC) 10 MG tablet Take 10 mg by mouth at bedtime.      cloNIDine (CATAPRES) 0.1 MG tablet Take 0.2 mg by mouth daily.     Docusate Sodium 100 MG capsule Take 200 mg by mouth at bedtime. Takes 2 tablets at bedtime.     ferrous sulfate 325 (65 FE) MG tablet Take 325 mg by mouth daily with breakfast.     labetalol (NORMODYNE) 300 MG tablet Take 900 mg by mouth 2 (two) times daily.     lisinopril (ZESTRIL) 40 MG tablet Take 40 mg by mouth daily.     Multiple Vitamin (MULTIVITAMIN WITH MINERALS) TABS tablet Take 1 tablet by mouth daily.     Omega-3 Fatty Acids (OMEGA 3 500 PO) Take by mouth daily.     Potassium Chloride ER 20 MEQ TBCR Take 1 tablet by mouth daily.     spironolactone (ALDACTONE) 50 MG tablet Take 50 mg by mouth daily.     tadalafil (CIALIS) 5 MG tablet Take 5 mg by mouth as needed.      EDARBI 80 MG TABS Take 1 tablet by mouth daily.     spironolactone (ALDACTONE) 25 MG tablet Take 0.5 tablets (12.5 mg total) by mouth daily. (Patient taking differently: Take 25 mg by mouth daily.) 45 tablet 3   pantoprazole (PROTONIX) 40 MG tablet Take 1 tablet (40 mg total) by mouth daily before breakfast. (Patient not taking: Reported on 04/13/2021) 30 tablet 1   No facility-administered medications prior to visit.     Allergies:   Ciprofloxacin and Levaquin [levofloxacin in d5w]   Social History   Socioeconomic History   Marital status: Married    Spouse name: Not on file   Number of children: Not on file   Years of education: Not on file   Highest education level: Not on file  Occupational History   Occupation: Physician    Comment: Neurologist  Tobacco Use   Smoking status: Never   Smokeless tobacco: Never  Vaping Use   Vaping Use:  Never used  Substance and Sexual Activity   Alcohol use: Yes    Alcohol/week: 6.0 standard drinks    Types: 1 Glasses of wine, 5 Standard drinks or equivalent per week   Drug use: No   Sexual activity: Yes    Partners: Female  Other Topics Concern   Not on file  Social History Narrative   Not on file   Social Determinants of Health   Financial Resource Strain: Not on file  Food Insecurity: Not on file  Transportation Needs: Not on file  Physical Activity: Not on file  Stress: Not on file  Social Connections: Not on file     Family History:  The patient's family history includes Aneurysm in his father.   Review of Systems:    Please  see the history of present illness.     All other systems reviewed and are otherwise negative except as noted above.   Physical Exam:    VS:  BP 122/74   Pulse 68   Ht 5\' 9"  (1.753 m)   Wt 179 lb (81.2 kg)   SpO2 99%   BMI 26.43 kg/m    General: Well developed, well nourished,male appearing in no acute distress. Head: Normocephalic, atraumatic. Neck: No carotid bruits. JVD not elevated.  Lungs: Respirations regular and unlabored, without wheezes or rales.  Heart: Regular rate and rhythm. No S3 or S4.  No murmur, no rubs, or gallops appreciated. Abdomen: Appears non-distended. No obvious abdominal masses. Msk:  Strength and tone appear normal for age. No obvious joint deformities or effusions. Extremities: No clubbing or cyanosis. No pitting edema.  Distal pedal pulses are 2+ bilaterally. Neuro: Alert and oriented X 3. Moves all extremities spontaneously. No focal deficits noted. Psych:  Responds to questions appropriately with a normal affect. Skin: No rashes or lesions noted  Wt Readings from Last 3 Encounters:  04/13/21 179 lb (81.2 kg)  03/28/21 179 lb 8 oz (81.4 kg)  02/15/21 179 lb 9.6 oz (81.5 kg)     Studies/Labs Reviewed:   EKG:  EKG is not ordered today.   Recent Labs: 02/07/2021: ALT 18; Magnesium 2.2; TSH  0.770 02/08/2021: BUN 14; Creatinine, Ser 1.35; Hemoglobin 9.0; Platelets 206; Potassium 3.2; Sodium 138   Lipid Panel    Component Value Date/Time   CHOL 144 01/09/2021 1018   TRIG 60 01/09/2021 1018   HDL 31 (L) 01/09/2021 1018   CHOLHDL 4.6 01/09/2021 1018   VLDL 12 01/09/2021 1018   LDLCALC 101 (H) 01/09/2021 1018    Additional studies/ records that were reviewed today include:   Cardiac Catheterization: 01/2021   Minimal to mild disease in the mid RCA and mild diffuse disease in the LAD with tapering after diagonal branch.  No significant lesion.   The left ventricular systolic function is normal.  The left ventricular ejection fraction is 55-65% by visual estimate.   LV end diastolic pressure is normal.   There is no aortic valve stenosis.   SUMMARY: Angiographically minimal CAD-stable from 2013 Suspect nonmicrovascular/anginal chest pain Normal LVEF and EDP.     RECOMMENDATIONS Monitor overnight on telemetry to ensure that these episodes of chest discomfort not related to any type of arrhythmia.  Otherwise anticipate discharge tomorrow. He does have a 2D echocardiogram ordered which can be completed.   Echocardiogram: 01/2021 IMPRESSIONS     1. The left ventricular apex and RV are heavily trabeculated. Consider  cardiac MRI to assess for ventricular noncompaction. Left ventricular  ejection fraction, by estimation, is 60 to 65%. The left ventricle has  normal function. The left ventricle has  no regional wall motion abnormalities. There is mild concentric left  ventricular hypertrophy. Left ventricular diastolic parameters are  consistent with Grade II diastolic dysfunction (pseudonormalization). The  average left ventricular global longitudinal   strain is -19.2 %. The global longitudinal strain is normal.   2. Right ventricular systolic function is normal. The right ventricular  size is normal. There is normal pulmonary artery systolic pressure.   3. Left atrial  size was severely dilated.   4. Right atrial size was moderately dilated.   5. The mitral valve is normal in structure. Trivial mitral valve  regurgitation. No evidence of mitral stenosis.   6. The aortic valve is tricuspid. Aortic valve regurgitation is  not  visualized. No aortic stenosis is present.   7. The inferior vena cava is normal in size with greater than 50%  respiratory variability, suggesting right atrial pressure of 3 mmHg.    Holter Monitor: 01/2021 ZIO XT reviewed.  2 days, 22 hours analyzed.  Predominant rhythm is sinus with heart rate ranging from 57 bpm up to 109 bpm and average heart rate 80 bpm.  There were rare PACs including couplets representing less than 1% total beats.  Occasional PVCs were noted representing 1.3% total beats.  Limited episode of ventricular trigeminy noted.  No sustained arrhythmias or pauses.  cMRI: 03/2021 IMPRESSION: 1.  Normal LV and RV size and function, LVEF 66%.   2. Evidence of non-compaction in the inferolateral and lateral wall of the LV myocardium. [place]:C Ratio 3:1   3.  No significant valvular abnormalities.   4.  Non contrast study, as such LGE not evaluated.  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 Medication management   3. History of chest pain   4. Essential hypertension   5. OSA on CPAP   6. Thalassemia minor      Plan:   In order of problems listed above:  1. Non-compaction Cardiomyopathy - This was suggested by prior echocardiogram imaging and recent cMRI did show evidence of noncompaction in the inferior lateral and lateral wall of the myocardium and EF was preserved at 66%.  - Reviewed with Dr. Domenic Polite and will continue his current cardiac medications for now given his preserved EF. He has already been referred to the [facility] to see if any additional adjustments are needed. He does have 4 adult children and would benefit from genetic testing/counseling.  - Given his  low PVC burden of 1.3% by recent monitor and overall well-controlled symptoms, will continue Labetalol at current dosing for now. By review of guidelines, anticoagulation is not indicated unless his EF declines to < 40% or noted to have episodes of atrial fibrillation but no known history of this or evidence of significant arrhythmias by prior monitoring.   2. History of Chest Pain - Prior cath in 2013 showed minor luminal irregularities and repeat cath in 01/2021 showed mild disease along the LAD but no significant stenosis as outlined above. Continue with risk factor modification.   3. HTN - His blood pressure is well-controlled at 122/74 during today's visit. He did recently switch from [place] to Lisinopril as outlined above and Spironolactone was also recently titrated by Nephrology given his elevated aldosterone level. CT Abdomen in 02/2021 did show a nodule along the left adrenal gland but similar to prior imaging and most compatible with an adenoma.  - Continue current regimen for now with Amlodipine 10mg  daily, Clonidine 0.2mg  daily, Labetalol 900mg  BID, Lisinopril 40mg  daily and Spironolactone 50mg  daily. Will recheck a BMET given recent titration of Spironolactone.   4. OSA - He does continue to use his CPAP on a nightly basis.   5. Thalassemia Minor - He is followed closely by Dr. Marval Regal and received a transfusion a few months ago with improvement in his fatigue and palpitations. Hgb was stable at 9.5 on most recent check.   Medication Adjustments/Labs and Tests Ordered: Current medicines are reviewed at length with the patient today.  Concerns regarding medicines are outlined above.  Medication changes, Labs and Tests ordered today are listed in the Patient Instructions below. Patient Instructions  Medication Instructions:  Your physician recommends that you continue on your  current medications as directed. Please refer to the Current Medication list given to you today.  *If you  need a refill on your cardiac medications before your next appointment, please call your pharmacy*   Lab Work: Your physician recommends that you return for lab work in: Environmental manager)   If you have labs (blood work) drawn today and your tests are completely normal, you will receive your results only by: [employer] (if you have MyChart) OR A paper copy in the mail If you have any lab test that is abnormal or we need to change your treatment, we will call you to review the results.   Testing/Procedures: NONE    Follow-Up: At [facility], you and your health needs are our priority.  As part of our continuing mission to provide you with exceptional heart care, we have created designated Provider Care Teams.  These Care Teams include your primary Cardiologist (physician) and Advanced Practice Providers (APPs -  Physician Assistants and Nurse Practitioners) who all work together to provide you with the care you need, when you need it.  We recommend signing up for the patient portal called "MyChart".  Sign up information is provided on this After Visit Summary.  MyChart is used to connect with patients for Virtual Visits (Telemedicine).  Patients are able to view lab/test results, encounter notes, upcoming appointments, etc.  Non-urgent messages can be sent to your provider as well.   To learn more about what you can do with MyChart, go to [email or web address].    Your next appointment:   6 month(s)  The format for your next appointment:   In Person  Provider:   Rozann Lesches, MD    Other Instructions Thank you for choosing [facility]!     Signed, Erma Heritage, PA-C  04/13/2021 7:52 PM    [place] [address] [place], [place] [address] Phone: [phone] Fax: [phone]

## 2021-04-27 ENCOUNTER — Ambulatory Visit: Payer: 59

## 2021-04-27 ENCOUNTER — Encounter (INDEPENDENT_AMBULATORY_CARE_PROVIDER_SITE_OTHER): Payer: Self-pay

## 2021-05-04 ENCOUNTER — Other Ambulatory Visit: Payer: Self-pay

## 2021-05-04 ENCOUNTER — Encounter (HOSPITAL_COMMUNITY): Payer: Self-pay | Admitting: Cardiology

## 2021-05-04 ENCOUNTER — Ambulatory Visit (HOSPITAL_COMMUNITY)
Admission: RE | Admit: 2021-05-04 | Discharge: 2021-05-04 | Disposition: A | Discharge status: Home / Self Care | Payer: 59 | Source: Ambulatory Visit | Attending: Cardiology | Admitting: Cardiology

## 2021-05-04 VITALS — BP 128/70 | HR 73 | Wt 177.4 lb

## 2021-05-04 DIAGNOSIS — R0609 Other forms of dyspnea: Secondary | ICD-10-CM | POA: Insufficient documentation

## 2021-05-04 DIAGNOSIS — I2510 Atherosclerotic heart disease of native coronary artery without angina pectoris: Secondary | ICD-10-CM | POA: Insufficient documentation

## 2021-05-04 DIAGNOSIS — N183 Chronic kidney disease, stage 3 unspecified: Secondary | ICD-10-CM | POA: Insufficient documentation

## 2021-05-04 DIAGNOSIS — I129 Hypertensive chronic kidney disease with stage 1 through stage 4 chronic kidney disease, or unspecified chronic kidney disease: Secondary | ICD-10-CM | POA: Insufficient documentation

## 2021-05-04 DIAGNOSIS — I493 Ventricular premature depolarization: Secondary | ICD-10-CM | POA: Diagnosis not present

## 2021-05-04 DIAGNOSIS — G4733 Obstructive sleep apnea (adult) (pediatric): Secondary | ICD-10-CM | POA: Diagnosis not present

## 2021-05-04 DIAGNOSIS — Z8249 Family history of ischemic heart disease and other diseases of the circulatory system: Secondary | ICD-10-CM | POA: Insufficient documentation

## 2021-05-04 DIAGNOSIS — Z79899 Other long term (current) drug therapy: Secondary | ICD-10-CM | POA: Diagnosis not present

## 2021-05-04 DIAGNOSIS — I429 Cardiomyopathy, unspecified: Secondary | ICD-10-CM | POA: Insufficient documentation

## 2021-05-04 DIAGNOSIS — Z9989 Dependence on other enabling machines and devices: Secondary | ICD-10-CM | POA: Insufficient documentation

## 2021-05-04 DIAGNOSIS — R748 Abnormal levels of other serum enzymes: Secondary | ICD-10-CM | POA: Insufficient documentation

## 2021-05-04 DIAGNOSIS — I5022 Chronic systolic (congestive) heart failure: Secondary | ICD-10-CM | POA: Diagnosis not present

## 2021-05-04 LAB — BASIC METABOLIC PANEL
Anion gap: 7 (ref 5–15)
BUN: 20 mg/dL (ref 6–20)
CO2: 26 mmol/L (ref 22–32)
Calcium: 9.6 mg/dL (ref 8.9–10.3)
Chloride: 106 mmol/L (ref 98–111)
Creatinine, Ser: 1.66 mg/dL — ABNORMAL HIGH (ref 0.61–1.24)
GFR, Estimated: 48 mL/min — ABNORMAL LOW (ref >60)
Glucose, Bld: 91 mg/dL (ref 70–99)
Potassium: 4 mmol/L (ref 3.5–5.1)
Sodium: 139 mmol/L (ref 135–145)

## 2021-05-04 LAB — CBC
HCT: 33.1 % — ABNORMAL LOW (ref 39.0–52.0)
Hemoglobin: 9.9 g/dL — ABNORMAL LOW (ref 13.0–17.0)
MCH: 23.1 pg — ABNORMAL LOW (ref 26.0–34.0)
MCHC: 29.9 g/dL — ABNORMAL LOW (ref 30.0–36.0)
MCV: 77.3 fL — ABNORMAL LOW (ref 80.0–100.0)
Platelets: 192 10*3/uL (ref 150–400)
RBC: 4.28 MIL/uL (ref 4.22–5.81)
RDW: 18.8 % — ABNORMAL HIGH (ref 11.5–15.5)
WBC: 3.8 10*3/uL — ABNORMAL LOW (ref 4.0–10.5)
nRBC: 0 % (ref 0.0–0.2)

## 2021-05-04 LAB — BRAIN NATRIURETIC PEPTIDE: B Natriuretic Peptide: 74.5 pg/mL (ref 0.0–100.0)

## 2021-05-04 MED ORDER — CARVEDILOL 25 MG PO TABS: 25.0000 mg | ORAL_TABLET | Freq: Two times a day (BID) | ORAL | 11 refills | Status: DC

## 2021-05-04 NOTE — Patient Instructions (Signed)
EKG done today.  Labs done today. We will contact you only if your labs are abnormal.  STOP taking Labetalol  START Carvedilol 25mg  (1 tablet) by mouth 2 times daily.   No other medication changes were made. Please continue all current medications as prescribed.  Your physician has recommended that you have a cardiopulmonary stress test (CPX). CPX testing is a non-invasive measurement of heart and lung function. It replaces a traditional treadmill stress test. This type of test provides a tremendous amount of information that relates not only to your present condition but also for future outcomes. This test combines measurements of you ventilation, respiratory gas exchange in the lungs, electrocardiogram (EKG), blood pressure and physical response before, during, and following an exercise protocol. Please refer to the handout provided to you during your appointment today.  You have been referred to Dr. Broadus John. That office will contact you to schedule an appointment.   Your physician recommends that you schedule a follow-up appointment in: 3 months  If you have any questions or concerns before your next appointment please send [place] a message through [place] or call our office at [phone].    TO LEAVE A MESSAGE FOR THE NURSE SELECT OPTION 2, PLEASE LEAVE A MESSAGE INCLUDING: YOUR NAME DATE OF BIRTH CALL BACK NUMBER REASON FOR CALL**this is important as we prioritize the call backs  YOU WILL RECEIVE A CALL BACK THE SAME DAY AS LONG AS YOU CALL BEFORE 4:00 PM   Do the following things EVERYDAY: Weigh yourself in the morning before breakfast. Write it down and keep it in a log. Take your medicines as prescribed Eat low salt foods--Limit salt (sodium) to 2000 mg per day.  Stay as active as you can everyday Limit all fluids for the day to less than 2 liters   At the [facility], you and your health needs are our priority. As part of our continuing mission to provide  you with exceptional heart care, we have created designated Provider Care Teams. These Care Teams include your primary Cardiologist (physician) and Advanced Practice Providers (APPs- Physician Assistants and Nurse Practitioners) who all work together to provide you with the care you need, when you need it.   You may see any of the following providers on your designated Care Team at your next follow up: Dr Glori Bickers Dr Haynes Kerns, NP Lyda Jester, [place] Audry Riles, PharmD   Please be sure to bring in all your medications bottles to every appointment.

## 2021-05-05 NOTE — Addendum Note (Signed)
Encounter addended by: Larey Dresser, MD on: 05/05/2021 5:15 PM  Actions taken: Clinical Note Signed

## 2021-05-05 NOTE — Progress Notes (Addendum)
PCP: Dr. Gerarda Fraction Cardiology: Dr. Domenic Polite HF Cardiology: Dr. Aundra Dubin  55 y.o. 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 Domenic Polite for evaluation of noncompaction.  Patient is a Surveyor, mining in [place].  He has had a number of chronic medical issues, main problems being long-standing HTN on multiple agents and CKD stage 3.  He is followed by Dr. Marval Regal for nephrology. He has had a chronic CK elevation (mild) and has been seen by rheumatology with no definite etiology.  He uses CPAP for OSA.  He has had episodes of chest pain in the past.  He has had 2 caths, 2013 and 9/22, both showed mild nonobstructive CAD.   More recently, he had an echo in 9/22 during workup of atypical chest pain that showed heavy LV apical and RV apical trabeculation concerning for noncompaction.  LV systolic function was normal, EF 60-65%.  This was followed up with a cardiac MRI that confirmed noncompaction by quantitative definition in the lateral wall. No contrast was used so delayed enhancement was not done.  Patient has history of PVCs, 9/22 Zio monitor showed 1.3% PVCs but no other arrhythmias.    In terms of family history, he reports that his father had a ruptured thoracic aortic aneurysm.  He had CTA chest in 8/22 with normal caliber aorta.  No FH of cardiomyopathy.   Symptomatically, he has mild dyspnea after walking up 2 flights of stairs.  No dyspnea walking on flat ground.  No palpitations currently, has had in the past (attributed to PVCs).  No chest pain currently but earlier in the fall was having atypical chest pain (triggered cath in 9/22 showing nonobstructive CAD).  He does note bendopnea.    ECG: NSR, LVH (personally reviewed)  Labs (8/22): LDL 101 Labs (9/22): hgb 9.5, LFTs normal, K 4, creatinine 1.47, CK 424.  PMH: 1. Chest pain: LHC in 2013 with LIs.  LHC in 9/22 with  nonobstructive CAD.  2. Hyperlipidemia 3. HTN: Long-standing 4. CKD: stage 3 5. Thalassemia minor: Has periodic Fe infusions.  6. OSA: CPAP.  7. PVCs: 9/22 Zio monitor with 1.3% PVCs.  8. Chronic CK elevation: Mild. Had workup in the past with rheumatology, no definite cause found.  9. Noncompaction cardiomyopathy:  - Echo (9/22): LV EF 79-02%, grade 2 diastolic dysfunction, mild LVH, LV apex and RV with heavy trabeculation. Normal RV size and systolic function.  Moderate biatrial enlargement.  - Cardiac MRI (11/22): LV EF 66%, noncompaction in inferolateral and anterolateral walls by published criteria, RV EF 67%, normal atria.   FH: Father with HTN, had thoracic aortic aneurysm that ruptured.  Diabetes.   SH: Married, 4 children, lives in [place].  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  No smoking, rare ETOH.   ROS: All systems reviewed and negative except as per HPI.   Current Outpatient Medications  Medication Sig Dispense Refill   acyclovir (ZOVIRAX) 800 MG tablet TAKE 1 TABLET EVERY DAY 31 tablet 12   amLODipine (NORVASC) 10 MG tablet Take 10 mg by mouth at bedtime.      Azilsartan Medoxomil (EDARBI) 80 MG TABS Take 1 tablet by mouth daily.     carvedilol (COREG) 25 MG tablet Take 1 tablet (25 mg total) by mouth 2 (two) times daily. 60 tablet 11   cloNIDine (CATAPRES) 0.1 MG tablet Take 0.2 mg by mouth daily.     Docusate Sodium 100 MG capsule Take 200  mg by mouth at bedtime. Takes 2 tablets at bedtime.     ferrous sulfate 325 (65 FE) MG tablet Take 325 mg by mouth daily with breakfast.     Multiple Vitamin (MULTIVITAMIN WITH MINERALS) TABS tablet Take 1 tablet by mouth daily.     Omega-3 Fatty Acids (OMEGA 3 500 PO) Take by mouth daily.     Potassium Chloride ER 20 MEQ TBCR Take 1 tablet by mouth daily.     spironolactone (ALDACTONE) 50 MG tablet Take 50 mg by mouth daily.     tadalafil (CIALIS) 5 MG tablet Take 5 mg by mouth daily in the afternoon.     No current  facility-administered medications for this encounter.   BP 128/70   Pulse 73   Wt 80.5 kg (177 lb 6.4 oz)   SpO2 99%   BMI 26.20 kg/m  General: NAD Neck: No JVD, no thyromegaly or thyroid nodule.  Lungs: Clear to auscultation bilaterally with normal respiratory effort. CV: Nondisplaced PMI.  Heart regular S1/S2, no S3/S4, no murmur.  No peripheral edema.  No carotid bruit.  Normal pedal pulses.  Abdomen: Soft, nontender, no hepatosplenomegaly, no distention.  Skin: Intact without lesions or rashes.  Neurologic: Alert and oriented x 3.  Psych: Normal affect. Extremities: No clubbing or cyanosis.  HEENT: Normal.   Assessment/Plan:  1. Noncompaction on echo and cMRI:  Patient has prominent trabeculation with deep intertrabecular recesses, fitting the definition of noncompaction particularly in the lateral wall by MRI. It is possible to get remodeling that looks like noncompaction with HTN and hematologic abnormalities like thalassemia, both of which the patient has.  On the cardiac MRI, contrast was not given so cannot comment on LGE.  This may be isolated LV noncompaction, but cannot rule out noncompaction phenotype due to loading conditions (HTN, anemia from thalassemia).  The LV EF is normal and there are no other significant abnormalities.  In terms of associated arrhythmias, he had PVCs on 9/22 Zio monitor but not extensive (1.3% beats).  - Consider genetic evaluation (LV noncompaction can be associated with known gene mutations) => would refer to Lattie Corns our geneticist to see if genetic testing would be warranted.  However, in the absence of low EF and the presence of other potential causes of hypertrabeculation (HTN), may not be warranted.  - Would follow echo yearly for the next couple of years to make sure LV EF does not decline.  - Would focus on treatment of HTN.  - Would recommend transitioning from labetalol to a more cardiomyopathy-specific beta blocker (stop labetalol, start  Coreg 25 mg bid).  - Check BNP.  2. Exertional dyspnea: Unexplained.  He is not volume overloaded on exam.  Recent LHC showed no significant coronary disease.  - I will arrange for CPX to assess.  - BNP as above.  3. HTN: Long-standing on multiple meds.  BP appears controlled currently.  - As above, will stop labetalol and start Coreg 25 mg bid.  He will let me know if BP control worsens (he checks at home).  4. Elevated CK: Mild elevation, no explanation on workup in past.  5. CAD: Mild, nonobstructive on 9/22 cath.  - Will check lipids.  If LDL significantly elevated consider treatment.  Would avoid statin with chronic CK elevation (consider PCSK-9 or Leqvio).   Followup in 3 months.   Loralie Champagne 05/05/2021 5:13 PM

## 2021-05-25 ENCOUNTER — Encounter (HOSPITAL_COMMUNITY): Payer: 59

## 2021-07-06 ENCOUNTER — Encounter: Payer: Self-pay | Admitting: Cardiology

## 2021-07-07 ENCOUNTER — Ambulatory Visit: Payer: 59 | Admitting: Cardiology

## 2021-07-10 ENCOUNTER — Other Ambulatory Visit (HOSPITAL_COMMUNITY): Payer: Self-pay | Admitting: Nephrology

## 2021-07-10 ENCOUNTER — Telehealth (HOSPITAL_COMMUNITY): Payer: Self-pay

## 2021-07-10 DIAGNOSIS — E269 Hyperaldosteronism, unspecified: Secondary | ICD-10-CM

## 2021-07-10 NOTE — Telephone Encounter (Signed)
-----   Message from Arne Cleveland, MD sent at 07/10/2021  3:02 PM EST ----- Regarding: RE: adrenal vein sampling Yes that is fine  DDH   ----- Message ----- From: Danielle Dess Sent: 07/10/2021   2:44 PM EST To: Arne Cleveland, MD Subject: adrenal vein sampling                          Hassell,   Can I put this on for you on Friday here at [facility]? I just received the referral from [facility] for [name] to have an adrenal vein sampling (this could be amenable to surgical cure  if it lateralizes in the left adrenal). Ordered by Dr. Marval Regal. Last CT scan done in October is in epic.  Please review.   Thanks,  Lia Foyer

## 2021-07-11 ENCOUNTER — Telehealth (HOSPITAL_COMMUNITY): Payer: Self-pay

## 2021-07-11 NOTE — Telephone Encounter (Signed)
Called pt to schedule vein sampling, working on 3/2 per patient request. Will confirm with the doc that is here that day and call the pt back to give instructions. AW

## 2021-07-12 ENCOUNTER — Telehealth (HOSPITAL_COMMUNITY): Payer: Self-pay

## 2021-07-12 NOTE — Telephone Encounter (Signed)
-----   Message from Aletta Edouard, MD sent at 07/11/2021  1:39 PM EST ----- Regarding: RE: venous sampling That's fine for me that day.  GY   ----- Message ----- From: Danielle Dess Sent: 07/11/2021   1:37 PM EST To: Aletta Edouard, MD Subject: venous sampling                                Yama,   Can I put this on for you on 3/2 here at [facility]? You are here that day and this is the day that is good for the patient.  I just received the referral from [facility] for [name] to have an adrenal vein sampling (this could be amenable to surgical cure if it lateralizes in the left adrenal). Ordered by Dr. Marval Regal. Last CT scan done in October is in epic.  Please review.   Thanks,  Lia Foyer

## 2021-07-26 ENCOUNTER — Other Ambulatory Visit: Payer: Self-pay | Admitting: Radiology

## 2021-07-27 ENCOUNTER — Ambulatory Visit (HOSPITAL_COMMUNITY)
Admission: RE | Admit: 2021-07-27 | Discharge: 2021-07-27 | Disposition: A | Discharge status: Home / Self Care | Payer: 59 | Source: Ambulatory Visit | Attending: Nephrology | Admitting: Nephrology

## 2021-07-27 ENCOUNTER — Other Ambulatory Visit: Payer: Self-pay

## 2021-07-27 ENCOUNTER — Other Ambulatory Visit (HOSPITAL_COMMUNITY): Payer: Self-pay | Admitting: Nephrology

## 2021-07-27 DIAGNOSIS — I129 Hypertensive chronic kidney disease with stage 1 through stage 4 chronic kidney disease, or unspecified chronic kidney disease: Secondary | ICD-10-CM | POA: Diagnosis not present

## 2021-07-27 DIAGNOSIS — E269 Hyperaldosteronism, unspecified: Secondary | ICD-10-CM | POA: Diagnosis not present

## 2021-07-27 DIAGNOSIS — N183 Chronic kidney disease, stage 3 unspecified: Secondary | ICD-10-CM | POA: Insufficient documentation

## 2021-07-27 HISTORY — PX: IR US GUIDE VASC ACCESS LEFT: IMG2389

## 2021-07-27 HISTORY — PX: IR VENOGRAM RENAL BI: IMG682

## 2021-07-27 HISTORY — PX: IR ANGIOGRAM ADRENAL BILATERAL SELECTIVE: IMG660

## 2021-07-27 HISTORY — PX: IR US GUIDE VASC ACCESS RIGHT: IMG2390

## 2021-07-27 HISTORY — PX: IR VENOUS SAMPLING: IMG694

## 2021-07-27 LAB — CBC
HCT: 34.5 % — ABNORMAL LOW (ref 39.0–52.0)
Hemoglobin: 10.6 g/dL — ABNORMAL LOW (ref 13.0–17.0)
MCH: 23.8 pg — ABNORMAL LOW (ref 26.0–34.0)
MCHC: 30.7 g/dL (ref 30.0–36.0)
MCV: 77.4 fL — ABNORMAL LOW (ref 80.0–100.0)
Platelets: 159 10*3/uL (ref 150–400)
RBC: 4.46 MIL/uL (ref 4.22–5.81)
RDW: 17.1 % — ABNORMAL HIGH (ref 11.5–15.5)
WBC: 2.7 10*3/uL — ABNORMAL LOW (ref 4.0–10.5)
nRBC: 0 % (ref 0.0–0.2)

## 2021-07-27 LAB — BASIC METABOLIC PANEL
Anion gap: 8 (ref 5–15)
BUN: 14 mg/dL (ref 6–20)
CO2: 28 mmol/L (ref 22–32)
Calcium: 9.1 mg/dL (ref 8.9–10.3)
Chloride: 105 mmol/L (ref 98–111)
Creatinine, Ser: 1.71 mg/dL — ABNORMAL HIGH (ref 0.61–1.24)
GFR, Estimated: 47 mL/min — ABNORMAL LOW (ref >60)
Glucose, Bld: 95 mg/dL (ref 70–99)
Potassium: 3.6 mmol/L (ref 3.5–5.1)
Sodium: 141 mmol/L (ref 135–145)

## 2021-07-27 LAB — CORTISOL
Cortisol, Plasma: 10.7 ug/dL
Cortisol, Plasma: 14.7 ug/dL
Cortisol, Plasma: 14.9 ug/dL
Cortisol, Plasma: 18.5 ug/dL
Cortisol, Plasma: 9.8 ug/dL
Cortisol, Plasma: 9.9 ug/dL
Cortisol, Plasma: 19 ug/dL
Cortisol, Plasma: 61.4 ug/dL
Cortisol, Plasma: >100 ug/dL
Cortisol, Plasma: >100 ug/dL

## 2021-07-27 LAB — PROTIME-INR
INR: 1.1 (ref 0.8–1.2)
Prothrombin Time: 14.1 seconds (ref 11.4–15.2)

## 2021-07-27 MED ORDER — DEXTROSE 5 % IV SOLN
250.0000 ug | Freq: Once | INTRAVENOUS | Status: AC
  Administered 2021-07-27: 0.25 mg via INTRAVENOUS
  Filled 2021-07-27 (×2): qty 0.25

## 2021-07-27 MED ORDER — SODIUM CHLORIDE 0.9 % IV SOLN: INTRAVENOUS | Status: DC

## 2021-07-27 MED ORDER — IOHEXOL 300 MG/ML  SOLN: 100.0000 mL | Freq: Once | INTRAMUSCULAR | Status: DC | PRN

## 2021-07-27 MED ORDER — LIDOCAINE HCL 1 % IJ SOLN
INTRAMUSCULAR | Status: AC
  Administered 2021-07-27: 10 mL
  Filled 2021-07-27: qty 20

## 2021-07-27 MED ORDER — FENTANYL CITRATE (PF) 100 MCG/2ML IJ SOLN
INTRAMUSCULAR | Status: AC
  Filled 2021-07-27: qty 4

## 2021-07-27 MED ORDER — MIDAZOLAM HCL 2 MG/2ML IJ SOLN
INTRAMUSCULAR | Status: AC
  Filled 2021-07-27: qty 4

## 2021-07-27 MED ORDER — FENTANYL CITRATE (PF) 100 MCG/2ML IJ SOLN
INTRAMUSCULAR | Status: AC | PRN
Start: 2021-07-27 — End: 2021-07-27
  Administered 2021-07-27: 25 ug via INTRAVENOUS
  Administered 2021-07-27: 50 ug via INTRAVENOUS
  Administered 2021-07-27 (×2): 25 ug via INTRAVENOUS

## 2021-07-27 MED ORDER — MIDAZOLAM HCL 2 MG/2ML IJ SOLN
INTRAMUSCULAR | Status: AC | PRN
  Administered 2021-07-27 (×4): .5 mg via INTRAVENOUS
  Administered 2021-07-27: 1 mg via INTRAVENOUS

## 2021-07-27 NOTE — H&P (Signed)
? ?Chief Complaint: ?Patient was seen in consultation today for No chief complaint on file. ? at the request of Coladonato,Joseph ? ?Referring Physician(s): ?Coladonato,Joseph ? ?Supervising Physician: Aletta Edouard ? ?Patient Status: [facility] - Out-pt ? ?History of Present Illness: ?Devin Wilson is a 56 y.o. male with hyperaldosteronism. Reports first documentation of this approximately 8 years ago.  Endorses chronic difficulty treating hypertension.  He presents to IR today for adrenal vein sampling with Dr. Kathlene Cote.   ? ?[name] reports feeling well today.  ROS negative.  He mentions his concern of elevated creatinine and use of contrast.  He is NPO, has arranged to go home with son this afternoon.   ? ?Past Medical History:  ?Diagnosis Date  ? CKD (chronic kidney disease) stage 3, GFR 30-59 ml/min (HCC)   ? GERD (gastroesophageal reflux disease)   ? Hyperlipidemia   ? Hypertension   ? Hypokalemia   ? Migraines   ? Sleep apnea   ? auto set-CPAP  ? Thalassemia minor   ? ? ?Past Surgical History:  ?Procedure Laterality Date  ? BIOPSY  02/02/2021  ? Procedure: BIOPSY;  Surgeon: [name], MD;  Location: [facility];  Service: Endoscopy;;  ? COLONOSCOPY  2006  ? Dr.Rourk @ [facility]  ? ESOPHAGOGASTRODUODENOSCOPY (EGD) WITH PROPOFOL N/A 02/02/2021  ? Procedure: ESOPHAGOGASTRODUODENOSCOPY (EGD) WITH PROPOFOL;  Surgeon: [name], MD;  Location: [facility];  Service: Endoscopy;  Laterality: N/A;  1030  ? HERNIA REPAIR    ? "in infancy; umbilical"  ? KNEE ARTHROSCOPY WITH MEDIAL MENISECTOMY Left 03/24/2018  ? Procedure: KNEE ARTHROSCOPY WITH MEDIAL MENISECTOMY,LATERAL MENISECTOMY, CHONDROPLASTY MEDIAL FEMUR;  Surgeon: [name], MD;  Location: [facility];  Service: Orthopedics;  Laterality: Left;  ? LEFT HEART CATH AND CORONARY ANGIOGRAPHY N/A 02/07/2021  ? Procedure: LEFT HEART CATH AND CORONARY ANGIOGRAPHY;  Surgeon: [name], MD;  Location: [facility];  Service:  Cardiovascular;  Laterality: N/A;  ? LEFT HEART CATHETERIZATION WITH CORONARY ANGIOGRAM N/A 08/10/2011  ? Procedure: LEFT HEART CATHETERIZATION WITH CORONARY ANGIOGRAM;  Surgeon: [name], MD;  Location: [facility] CATH LAB;  Service: Cardiovascular;  Laterality: N/A;  ? VASECTOMY    ? WISDOM TOOTH EXTRACTION    ? ? ?Allergies: ?Ciprofloxacin and Levaquin [levofloxacin in d5w] ? ?Medications: ?Prior to Admission medications   ?Medication Sig Start Date End Date Taking? Authorizing Provider  ?acyclovir (ZOVIRAX) 800 MG tablet TAKE 1 TABLET EVERY DAY 02/02/20  Yes Jerene Dilling, PA  ?amLODipine (NORVASC) 10 MG tablet Take 10 mg by mouth at bedtime.  10/31/17  Yes [provider]  ?Azilsartan Medoxomil (EDARBI) 80 MG TABS Take 80 mg by mouth daily.   Yes [provider]  ?cloNIDine (CATAPRES) 0.1 MG tablet Take 0.2 mg by mouth at bedtime.   Yes [provider]  ?docusate sodium (COLACE) 100 MG capsule Take 200 mg by mouth daily as needed for constipation.   Yes [provider]  ?labetalol (NORMODYNE) 300 MG tablet Take 900 mg by mouth 2 (two) times daily. 06/28/21  Yes [provider]  ?Potassium Chloride ER 20 MEQ TBCR Take 20 mEq by mouth in the morning and at bedtime. 01/12/20  Yes [provider]  ?spironolactone (ALDACTONE) 50 MG tablet Take 50 mg by mouth daily.   Yes [provider]  ?  ? ?Family History  ?Problem Relation Age of Onset  ? Aneurysm Father   ? Colon polyps Neg Hx   ?  Colon cancer Neg Hx   ? Esophageal cancer Neg Hx   ? Rectal cancer Neg Hx   ? Stomach cancer Neg Hx   ? ? ?Social History  ? ?Socioeconomic History  ? Marital status: Married  ?  Spouse name: Not on file  ? Number of children: Not on file  ? Years of education: Not on file  ? Highest education level: Not on file  ?Occupational History  ? 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 Smoking status: Never  ? Smokeless tobacco: Never  ?Vaping Use  ? Vaping Use:  Never used  ?Substance and Sexual Activity  ? Alcohol use: Yes  ?  Alcohol/week: 6.0 standard drinks  ?  Types: 1 Glasses of wine, 5 Standard drinks or equivalent per week  ? Drug use: No  ? Sexual activity: Yes  ?  Partners: Female  ?Other Topics Concern  ? Not on file  ?Social History Narrative  ? Not on file  ? ?Social Determinants of Health  ? ?Financial Resource Strain: Not on file  ?Food Insecurity: Not on file  ?Transportation Needs: Not on file  ?Physical Activity: Not on file  ?Stress: Not on file  ?Social Connections: Not on file  ? ? ?Review of Systems: A 12 point ROS discussed and pertinent positives are indicated in the HPI above.  All other systems are negative. ? ?Review of Systems  ?Constitutional: Negative.   ?HENT: Negative.    ?Eyes: Negative.   ?Respiratory: Negative.    ?Cardiovascular: Negative.   ?Gastrointestinal: Negative.   ?Endocrine: Negative.   ?Genitourinary: Negative.   ?Musculoskeletal: Negative.   ?Allergic/Immunologic: Negative.   ?Neurological: Negative.   ?Hematological: Negative.   ?Psychiatric/Behavioral: Negative.    ? ?Vital Signs: ?BP (!) 137/92   Pulse 64   Temp 98 ?F (36.7 ?C)   Resp 16   Ht 5\' 9"  (1.753 m)   Wt 175 lb (79.4 kg)   SpO2 100%   BMI 25.84 kg/m?  ? ?Physical Exam ?Vitals reviewed.  ?Constitutional:   ?   General: He is not in acute distress. ?   Appearance: Normal appearance.  ?HENT:  ?   Head: Normocephalic and atraumatic.  ?   Mouth/Throat:  ?   Mouth: Mucous membranes are moist.  ?   Pharynx: Oropharynx is clear.  ?Eyes:  ?   Extraocular Movements: Extraocular movements intact.  ?   Conjunctiva/sclera: Conjunctivae normal.  ?Cardiovascular:  ?   Rate and Rhythm: Normal rate.  ?   Pulses: Normal pulses.  ?   Heart sounds: Normal heart sounds.  ?Pulmonary:  ?   Effort: Pulmonary effort is normal.  ?   Breath sounds: Normal breath sounds.  ?Abdominal:  ?   General: Abdomen is flat. Bowel sounds are normal.  ?   Palpations: Abdomen is soft.   ?Musculoskeletal:  ?   Right lower leg: Edema present.  ?Skin: ?   General: Skin is warm and dry.  ?   Capillary Refill: Capillary refill takes less than 2 seconds.  ?Neurological:  ?   General: No focal deficit present.  ?   Mental Status: He is alert and oriented to person, place, and time.  ?Psychiatric:     ?   Mood and Affect: Mood normal.     ?   Behavior: Behavior normal.  ? ? ?Imaging: ?No results found. ? ?Labs: ? ?CBC: ?Recent Labs  ?  02/07/21 ?2751 02/07/21 ?2050 02/08/21 ?0300 05/04/21 ?  1651  ?WBC 4.6 4.3 4.4 3.8*  ?HGB 8.9* 8.7* 9.0* 9.9*  ?HCT 29.2* 27.8* 28.2* 33.1*  ?PLT 210 196 206 192  ? ? ?BMP: ?Recent Labs  ?  02/07/21 ?0655 02/07/21 ?1138 02/07/21 ?2050 02/08/21 ?0300 05/04/21 ?1651 07/27/21 ?0752  ?NA 141  --   --  138 139 141  ?K 2.8* 3.1*  --  3.2* 4.0 3.6  ?CL 104  --   --  104 106 105  ?CO2 28  --   --  28 26 28   ?GLUCOSE 125*  --   --  95 91 95  ?BUN 19  --   --  14 20 14   ?CALCIUM 9.2  --   --  8.7* 9.6 9.1  ?CREATININE 1.38*  --  1.26* 1.35* 1.66* 1.71*  ?GFRNONAA >60  --  >60 >60 48* 47*  ? ? ?LIVER FUNCTION TESTS: ?Recent Labs  ?  01/09/21 ?0757 02/07/21 ?2050  ?BILITOT 1.0 1.1  ?AST 22 20  ?ALT 21 18  ?ALKPHOS 139* 89  ?PROT 7.4 6.2*  ?ALBUMIN 3.9 3.0*  ? ? ? ?Assessment and Plan: ? ?Hyperaldosteronism ?--creatinine 1.71 this morning ?--for venous sampling today with expected discharge later today.   ?--Results to be sent to Dr. Marval Regal as well as endocrinologist, Dr. Dorris Fetch.   ? ? ?Thank you for this interesting consult.  I greatly enjoyed meeting Geremy Rister and look forward to participating in their care.  A copy of this report was sent to the requesting provider on this date. ? ?Electronically Signed: ?Pasty Spillers, PA ?07/27/2021, 8:52 AM ? ? ?I spent a total of 30 Minutes  in face to face in clinical consultation, greater than 50% of which was counseling/coordinating care for adrenal vein sampling. ? ?

## 2021-07-27 NOTE — Procedures (Signed)
Interventional Radiology Procedure Note ? ?Procedure: Bilateral adrenal venous sampling ? ?Complications: None ? ?Estimated Blood Loss: < 10 mL ? ?Findings: ?Both adrenal veins able to be catheterized. Venous sampling pre-stimulation: infrarenal IVC, suprarenal IVC, left renal vein, bilateral adrenal veins. ? ?Post stimulation sampling after cosyntropin administration: bilateral adrenal veins, left renal vein, suprarenal IVC. ? ?Eulas Post T. Kathlene Cote, M.D ?Pager:  [phone] ? ?  ?

## 2021-07-27 NOTE — Sedation Documentation (Signed)
Dr. Kathlene Cote at bedside to discuss procedure and plan of care with pt.  ?

## 2021-07-27 NOTE — Sedation Documentation (Addendum)
Suezanne Jacquet, PA at bedside to consent pt for procedure.  ?

## 2021-07-27 NOTE — Discharge Instructions (Signed)
Femoral Site Care ?This sheet gives you information about how to care for yourself after your procedure. Your health care provider may also give you more specific instructions. If you have problems or questions, contact your health care provider. ?What can I expect after the procedure? ? ?After the procedure, it is common to have: ?Bruising that usually fades within 1-2 weeks. ?Tenderness at the site. ?Follow these instructions at home: ?Wound care ?Follow instructions from your health care provider about how to take care of your insertion site. Make sure you: ?Wash your hands with soap and water before you change your bandage (dressing). If soap and water are not available, use hand sanitizer. ?Remove your dressing as told by your health care provider. In 24 hours ?Do not take baths, swim, or use a hot tub until your health care provider approves. ?You may shower 24-48 hours after the procedure or as told by your health care provider. ?Gently wash the site with plain soap and water. ?Pat the area dry with a clean towel. ?Do not rub the site. This may cause bleeding. ?Do not apply powder or lotion to the site. Keep the site clean and dry. ?Check your femoral site every day for signs of infection. Check for: ?Redness, swelling, or pain. ?Fluid or blood. ?Warmth. ?Pus or a bad smell. ?Activity ?For the first 2-3 days after your procedure, or as long as directed: ?Avoid climbing stairs as much as possible. ?Do not squat. ?Do not lift anything that is heavier than 10 lb (4.5 kg), or the limit that you are told, until your health care provider says that it is safe. For 3 days ?Rest as directed. ?Avoid sitting for a long time without moving. Get up to take short walks every 1-2 hours. ?Do not drive for 24 hours if you were given a medicine to help you relax (sedative). ?General instructions ?Take over-the-counter and prescription medicines only as told by your health care provider. ?Keep all follow-up visits as told by  your health care provider. This is important. ?Contact a health care provider if you have: ?A fever or chills. ?You have redness, swelling, or pain around your insertion site. ?Get help right away if: ?The catheter insertion area swells very fast. ?You pass out. ?You suddenly start to sweat or your skin gets clammy. ?The catheter insertion area is bleeding, and the bleeding does not stop when you hold steady pressure on the area. ?These symptoms may represent a serious problem that is an emergency. Do not wait to see if the symptoms will go away. Get medical help right away. Call your local emergency services (911 in the [place] Do not drive yourself to the hospital. ?Summary ?After the procedure, it is common to have bruising that usually fades within 1-2 weeks. ?Check your femoral site every day for signs of infection. ?Do not lift anything that is heavier than 10 lb (4.5 kg), or the limit that you are told, until your health care provider says that it is safe. ?This information is not intended to replace advice given to you by your health care provider. Make sure you discuss any questions you have with your health care provider. ?Document Revised: 05/27/2017 Document Reviewed: 05/27/2017 ?Elsevier Patient Education ? [place]. ? ?

## 2021-07-27 NOTE — Sedation Documentation (Signed)
Attempted to call report to SS.  ?

## 2021-08-03 ENCOUNTER — Encounter (HOSPITAL_COMMUNITY): Payer: 59 | Admitting: Cardiology

## 2021-08-03 LAB — ALDOSTERONE: Aldosterone: 53.5 ng/dL — ABNORMAL HIGH (ref 0.0–30.0)

## 2021-08-04 LAB — ALDOSTERONE
Aldosterone: 123.7 ng/dL — ABNORMAL HIGH (ref 0.0–30.0)
Aldosterone: 65.9 ng/dL — ABNORMAL HIGH (ref 0.0–30.0)
Aldosterone: 24.3 ng/dL (ref 0.0–30.0)
Aldosterone: 28.1 ng/dL (ref 0.0–30.0)
Aldosterone: 241.3 ng/dL — ABNORMAL HIGH (ref 0.0–30.0)
Aldosterone: 249 ng/dL — ABNORMAL HIGH (ref 0.0–30.0)

## 2021-08-11 LAB — ALDOSTERONE
Aldosterone: 6471.7 ng/dL — ABNORMAL HIGH (ref 0.0–30.0)
Aldosterone: 235.1 ng/dL — ABNORMAL HIGH (ref 0.0–30.0)

## 2021-08-17 LAB — ALDOSTERONE: Aldosterone: 23194.4 ng/dL — ABNORMAL HIGH (ref 0.0–30.0)

## 2021-08-20 ENCOUNTER — Other Ambulatory Visit: Payer: Self-pay | Admitting: Physician Assistant

## 2021-08-25 ENCOUNTER — Telehealth: Payer: Self-pay | Admitting: Family Medicine

## 2021-08-25 NOTE — Telephone Encounter (Signed)
Pt last visit was 04/2020 and he is scheduled for 4/6, but says that he needs a refill on his med before his appt. Please call him back. Thanks ?

## 2021-08-28 ENCOUNTER — Other Ambulatory Visit: Payer: Self-pay | Admitting: Family Medicine

## 2021-08-28 DIAGNOSIS — B009 Herpesviral infection, unspecified: Secondary | ICD-10-CM

## 2021-08-28 MED ORDER — ACYCLOVIR 800 MG PO TABS: 800.0000 mg | ORAL_TABLET | Freq: Every day | ORAL | 0 refills | Status: DC

## 2021-08-28 NOTE — Progress Notes (Signed)
Pt refill request sent.  Patient has not been seen as patient since 2021.  Appointment scheduled.   ? ?1 time refill done until after patient appointment.  ? 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 ?- acyclovir (ZOVIRAX) 800 MG tablet; Take 1 tablet (800 mg total) by mouth daily.  Dispense: 30 tablet; Refill: 0 ? ?RX sent to listed pharmacy.  ? ?Junious Dresser, FNP ? ?

## 2021-08-31 ENCOUNTER — Ambulatory Visit: Payer: Self-pay | Admitting: Family Medicine

## 2021-08-31 ENCOUNTER — Encounter: Payer: Self-pay | Admitting: Family Medicine

## 2021-08-31 DIAGNOSIS — Z113 Encounter for screening for infections with a predominantly sexual mode of transmission: Secondary | ICD-10-CM

## 2021-08-31 DIAGNOSIS — B009 Herpesviral infection, unspecified: Secondary | ICD-10-CM

## 2021-08-31 LAB — GRAM STAIN

## 2021-08-31 LAB — HM HIV SCREENING LAB: HM HIV Screening: NEGATIVE

## 2021-08-31 MED ORDER — VALACYCLOVIR HCL 1 G PO TABS: 1.0000 g | ORAL_TABLET | Freq: Every day | ORAL | 11 refills | Status: DC

## 2021-08-31 NOTE — Progress Notes (Signed)
[facility] Department ?STI clinic/screening visit ? ?Subjective:  ?Devin Wilson is a 56 y.o. male being seen today for an STI screening visit. The patient reports they do have symptoms.   ? ?Patient has the following medical conditions:   ?Patient Active Problem List  ? Diagnosis Date Noted  ? Hyperlipidemia 02/08/2021  ? Chronic hypokalemia 02/08/2021  ? OSA on CPAP 02/08/2021  ? Atypical chest pain   ? Exposure to sexually transmitted disease (STD) 12/08/2018  ? Gonorrhea 11/27/2018  ? S/P arthroscopy of left knee 03/24/18 04/10/2018  ? Old complex tear of medial meniscus of left knee   ? 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 Primary osteoarthritis of left knee   ? Stage 2 chronic kidney disease 01/13/2018  ? HSV-2 (herpes simplex virus 2) infection 12/20/2017  ? Medial meniscus, posterior horn derangement 02/20/2012  ? GERD (gastroesophageal reflux disease)   ? Chest pain on exertion 08/10/2011  ? Primary hypertension 08/10/2011  ? Unstable angina ([place]) 08/10/2011  ? Thalassemia minor   ? SPRAIN AND STRAIN OF METACARPOPHALANGEAL OF HAND 08/16/2009  ? ? ? ?Chief Complaint  ?Patient presents with  ? SEXUALLY TRANSMITTED DISEASE  ?  Screening  ? ? ?HPI ? ?Patient reports here for screening and wants to discuss changing HSV therapy  ? ?Does the patient or their partner desires a pregnancy in the next year? No ? ?Screening for MPX risk: ?Does the patient have an unexplained rash? No ?Is the patient MSM? No ?Does the patient endorse multiple sex partners or anonymous sex partners? Yes ?Did the patient have close or sexual contact with a person diagnosed with MPX? No ?Has the patient traveled outside the [place] where MPX is endemic? No ?Is there a high clinical suspicion for MPX-- evidenced by one of the following No ? -Unlikely to be chickenpox ? -Lymphadenopathy ? -Rash that present in same phase of evolution on any given body part ? ? ?See flowsheet for further details and programmatic  requirements.  ? ? ?The following portions of the patient's history were reviewed and updated as appropriate: allergies, current medications, past medical history, past social history, past surgical history and problem list. ? ?Objective:  ?There were no vitals filed for this visit. ? ?Physical Exam ?Constitutional:   ?   Appearance: Normal appearance.  ?HENT:  ?   Head: Normocephalic.  ?   Mouth/Throat:  ?   Mouth: Mucous membranes are moist.  ?   Pharynx: Oropharynx is clear. No oropharyngeal exudate.  ?Pulmonary:  ?   Effort: Pulmonary effort is normal.  ?Genitourinary: ?   Penis: Normal.   ?   Testes: Normal.  ?   Comments: No lice, nits, or pest, no lesions or odor discharge.  Denies pain or tenderness with paplation of testicles.  No lesions, ulcers or masses present.   ? ?Musculoskeletal:  ?   Cervical back: Normal range of motion.  ?Lymphadenopathy:  ?   Cervical: No cervical adenopathy.  ?Skin: ?   General: Skin is warm and dry.  ?   Findings: No bruising, erythema, lesion or rash.  ?Neurological:  ?   General: No focal deficit present.  ?   Mental Status: He is alert and oriented to person, place, and time.  ?Psychiatric:     ?   Mood and Affect: Mood normal.     ?   Behavior: Behavior normal.  ? ? ? ? ?Assessment and Plan:  ?[name] is a 56  y.o. male presenting to the [facility] Department for STI screening ? ?1. Screening examination for venereal disease ?Patient does have STI symptoms ?Patient accepted all screenings including  gram stain,  oral, urethral GC and bloodwork for HIV/RPR.  ?Patient meets criteria for HepB screening? No. Ordered? No -   ?Patient meets criteria for HepC screening? No. Ordered? No -   ?Recommended condom use with all sex ?Discussed importance of condom use for STI prevent ? ?Treat gram stain per standing order ?Discussed time line for [facility] Lab results and that patient will be called with positive results and encouraged patient to call if he had not heard in  2 weeks ?Recommended returning for continued or worsening symptoms.   ? ?- Gonococcus culture ?- Gram stain ?- HIV [place] [facility] ?- Syphilis Serology,  Lab ?- Gonococcus culture ? ?2. HSV-2 (herpes simplex virus 2) infection ?Pt desires to change therapy, has reported an increase in out breaks.  Does report that he was taking medication every other day and not daily as prescribed.   ? ?RX sent to listed pharmacy.  ? ?- valACYclovir (VALTREX) 1000 MG tablet; Take 1 tablet (1,000 mg total) by mouth daily.  Dispense: 30 tablet; Refill: 11 ? ? ?No follow-ups on file. ? ?Future Appointments  ?Date Time Provider [place]  ?10/02/2021  4:00 PM Satira Sark, MD [facility]  ?10/27/2021  2:40 PM Larey Dresser, MD [facility] None  ? ? ?Junious Dresser, FNP ?

## 2021-08-31 NOTE — Progress Notes (Signed)
Pt here for STD screening and refill on his medication.  Gram stain results reviewed, no treatment required per Provider.  Condoms given.  Windle Guard, RN ? ?

## 2021-09-04 LAB — GONOCOCCUS CULTURE

## 2021-09-12 ENCOUNTER — Other Ambulatory Visit: Payer: Self-pay | Admitting: Family Medicine

## 2021-09-12 NOTE — Progress Notes (Signed)
Call to patient to discuss TR.  Pt informed of + RPR and need for re-screening and treatment.  Discussed call from [place].   ? ?Pt verbalized understanding.   ? ? ?Junious Dresser, FNP ? ? ? ?

## 2021-09-21 ENCOUNTER — Ambulatory Visit: Payer: 59

## 2021-09-22 ENCOUNTER — Ambulatory Visit: Payer: Self-pay | Admitting: Family Medicine

## 2021-09-22 DIAGNOSIS — A539 Syphilis, unspecified: Secondary | ICD-10-CM

## 2021-09-22 DIAGNOSIS — Z113 Encounter for screening for infections with a predominantly sexual mode of transmission: Secondary | ICD-10-CM

## 2021-09-22 MED ORDER — PENICILLIN G BENZATHINE 1200000 UNIT/2ML IM SUSY
2.4000 10*6.[IU] | PREFILLED_SYRINGE | Freq: Once | INTRAMUSCULAR | Status: AC
  Administered 2021-09-22: 2.4 10*6.[IU] via INTRAMUSCULAR

## 2021-09-22 NOTE — Progress Notes (Signed)
Pt here for STD screening and treatment of Syphilis.  Bicillin 2.4 MU given IM without any complications.  Condoms declined.  Windle Guard, RN ? ?

## 2021-09-22 NOTE — Progress Notes (Signed)
S: Pt in clinic for Syphilis  treatment    ? ?O: RPR 1:64  ? ?A: 1. Screening examination for venereal disease ?- Syphilis Serology, [place] [facility] ?2. Syphilis ?- penicillin g benzathine (BICILLIN LA) 1200000 UNIT/2ML injection 2.4 Million Units ? ? ?P: pt RPR redrawn today.  Discussed with patient disease process, and treatment and post treatment.   ?Discussed would contact if additional treatment is needed.  Pt reported that he would be out of town for 2 weeks. Discussed if additional treatment is needed he would have to start treatment over.  Pt verbalizes understanding.   ?Pt to return in 6 months for recheck of titer and then yearly.  ?Pt treated by RN, see note.   ?Pt tolerated well and monitored for 15 mins.  ?Pt to call if any questions or concerns.   ? ? ? ?Junious Dresser, FNP ? ?

## 2021-10-02 ENCOUNTER — Ambulatory Visit: Payer: 59 | Admitting: Cardiology

## 2021-10-02 NOTE — Progress Notes (Deleted)
Cardiology Office Note  Date: 10/02/2021   ID: Devin Wilson, DOB [birth year], MRN [id number]  PCP:  Redmond School, MD  Cardiologist:  Rozann Lesches, MD Electrophysiologist:  None   No chief complaint on file.   History of Present Illness: Devin Wilson is a 56 y.o. male last seen in December 2022 by Dr. Aundra Dubin.  He had follow-up for LV noncompaction.  He was referred to consider genetic testing, also yearly echocardiography.  Also referred for CPX to evaluate shortness of breath.  Past Medical History:  Diagnosis Date   CKD (chronic kidney disease) stage 3, GFR 30-59 ml/min (HCC)    GERD (gastroesophageal reflux disease)    Hyperlipidemia    Hypertension    Hypokalemia    Migraines    Sleep apnea    auto set-CPAP   Thalassemia minor     Past Surgical History:  Procedure Laterality Date   BIOPSY  02/02/2021   Procedure: BIOPSY;  Surgeon: [name], MD;  [facility];  Service: Endoscopy;;   COLONOSCOPY  2006   Dr.Rourk @ [facility]   ESOPHAGOGASTRODUODENOSCOPY (EGD) WITH PROPOFOL N/A 02/02/2021   Procedure: ESOPHAGOGASTRODUODENOSCOPY (EGD) WITH PROPOFOL;  Surgeon: [name], MD;  [facility]: [facility];  Service: Endoscopy;  Laterality: N/A;  [place]     "in infancy; umbilical"   IR ANGIOGRAM ADRENAL BILATERAL SELECTIVE  07/27/2021   IR US GUIDE VASC ACCESS LEFT  07/27/2021   IR US GUIDE VASC ACCESS RIGHT  07/27/2021   IR VENOGRAM RENAL BI  07/27/2021   IR VENOUS SAMPLING  07/27/2021   IR VENOUS SAMPLING  07/27/2021   KNEE ARTHROSCOPY WITH MEDIAL MENISECTOMY Left 03/24/2018   Procedure: KNEE ARTHROSCOPY WITH MEDIAL MENISECTOMY,LATERAL MENISECTOMY, CHONDROPLASTY MEDIAL FEMUR;  Surgeon: [name], MD;  Location: [facility];  Service: Orthopedics;  Laterality: Left;   LEFT HEART CATH AND CORONARY ANGIOGRAPHY N/A 02/07/2021   Procedure: LEFT HEART CATH AND CORONARY ANGIOGRAPHY;  Surgeon: [name], MD;  Location: [facility];   Service: Cardiovascular;  Laterality: N/A;   LEFT HEART CATHETERIZATION WITH CORONARY ANGIOGRAM N/A 08/10/2011   Procedure: LEFT HEART CATHETERIZATION WITH CORONARY ANGIOGRAM;  Surgeon: [name], MD;  Location: [facility] C[facility] LAB;  Service: Cardiovascular;  Laterality: N/A;   VASECTOMY     WISDOM TOOTH EXTRACTION      Current Outpatient Medications  Medication Sig Dispense Refill   amLODipine (NORVASC) 10 MG tablet Take 10 mg by mouth at bedtime.      Azilsartan Medoxomil (EDARBI) 80 MG TABS Take 80 mg by mouth daily.     cloNIDine (CATAPRES) 0.1 MG tablet Take 0.2 mg by mouth at bedtime.     docusate sodium (COLACE) 100 MG capsule Take 200 mg by mouth daily as needed for constipation.     labetalol (NORMODYNE) 300 MG tablet Take 900 mg by mouth 2 (two) times daily.     Potassium Chloride ER 20 MEQ TBCR Take 20 mEq by mouth in the morning and at bedtime.     spironolactone (ALDACTONE) 50 MG tablet Take 50 mg by mouth daily.     valACYclovir (VALTREX) 1000 MG tablet Take 1 tablet (1,000 mg total) by mouth daily. 30 tablet 11   No current facility-administered medications for this visit.   Allergies:  Ciprofloxacin and Levaquin [levofloxacin in d5w]   Social History: The patient  reports that he has never smoked. He has never used smokeless tobacco. He reports  current alcohol use of about 4.0 standard drinks per week. He reports that he does not use drugs.   Family History: The patient's family history includes Aneurysm in his father.   ROS:  Please see the history of present illness. Otherwise, complete review of systems is positive for {NONE DEFAULTED:18576}.  All other systems are reviewed and negative.   Physical Exam: VS:  There were no vitals taken for this visit., BMI There is no height or weight on file to calculate BMI.  Wt Readings from Last 3 Encounters:  07/27/21 175 lb (79.4 kg)  05/04/21 177 lb 6.4 oz (80.5 kg)  04/13/21 179 lb (81.2 kg)    General: Patient appears  comfortable at rest. HEENT: Conjunctiva and lids normal, oropharynx clear with moist mucosa. Neck: Supple, no elevated JVP or carotid bruits, no thyromegaly. Lungs: Clear to auscultation, nonlabored breathing at rest. Cardiac: Regular rate and rhythm, no S3 or significant systolic murmur, no pericardial rub. Abdomen: Soft, nontender, no hepatomegaly, bowel sounds present, no guarding or rebound. Extremities: No pitting edema, distal pulses 2+. Skin: Warm and dry. Musculoskeletal: No kyphosis. Neuropsychiatric: Alert and oriented x3, affect grossly appropriate.  ECG:  An ECG dated 05/04/2021 was personally reviewed today and demonstrated:  Sinus bradycardia.  Recent Labwork: 02/07/2021: ALT 18; AST 20; Magnesium 2.2; TSH 0.770 05/04/2021: B Natriuretic Peptide 74.5 07/27/2021: BUN 14; Creatinine, Ser 1.71; Hemoglobin 10.6; Platelets 159; Potassium 3.6; Sodium 141     Component Value Date/Time   CHOL 144 01/09/2021 1018   TRIG 60 01/09/2021 1018   HDL 31 (L) 01/09/2021 1018   CHOLHDL 4.6 01/09/2021 1018   VLDL 12 01/09/2021 1018   LDLCALC 101 (H) 01/09/2021 1018    Other Studies Reviewed Today:  Cardiac catheterization 02/07/2021:   Minimal to mild disease in the mid RCA and mild diffuse disease in the LAD with tapering after diagonal branch.  No significant lesion.   The left ventricular systolic function is normal.  The left ventricular ejection fraction is 55-65% by visual estimate.   LV end diastolic pressure is normal.   There is no aortic valve stenosis.   SUMMARY: Angiographically minimal CAD-stable from 2013 Suspect nonmicrovascular/anginal chest pain Normal LVEF and EDP.  Cardiac monitor September 2022: ZIO XT reviewed.  2 days, 22 hours analyzed.  Predominant rhythm is sinus with heart rate ranging from 57 bpm up to 109 bpm and average heart rate 80 bpm.  There were rare PACs including couplets representing less than 1% total beats.  Occasional PVCs were noted representing  1.3% total beats.  Limited episode of ventricular trigeminy noted.  No sustained arrhythmias or pauses.  Cardiac MRI 04/06/2021: IMPRESSION: 1.  Normal LV and RV size and function, LVEF 66%.   2. Evidence of non-compaction in the inferolateral and lateral wall of the LV myocardium. [place]:C Ratio 3:1   3.  No significant valvular abnormalities.   4.  Non contrast study, as such LGE not evaluated.   Assessment and Plan:   Medication Adjustments/Labs and Tests Ordered: Current medicines are reviewed at length with the patient today.  Concerns regarding medicines are outlined above.   Tests Ordered: No orders of the defined types were placed in this encounter.   Medication Changes: No orders of the defined types were placed in this encounter.   Disposition:  Follow up {follow up:15908}  Signed, Satira Sark, MD, [facility] 10/02/2021 10:40 AM    [place] at [facility] [address] [place],  [place] [address] Phone: [phone]; Fax: [phone]

## 2021-10-27 ENCOUNTER — Ambulatory Visit (HOSPITAL_COMMUNITY)
Admission: RE | Admit: 2021-10-27 | Discharge: 2021-10-27 | Disposition: A | Discharge status: Home / Self Care | Payer: 59 | Source: Ambulatory Visit | Attending: Cardiology | Admitting: Cardiology

## 2021-10-27 VITALS — BP 102/74 | HR 84 | Ht 69.0 in | Wt 179.6 lb

## 2021-10-27 DIAGNOSIS — N183 Chronic kidney disease, stage 3 unspecified: Secondary | ICD-10-CM | POA: Diagnosis not present

## 2021-10-27 DIAGNOSIS — Z8249 Family history of ischemic heart disease and other diseases of the circulatory system: Secondary | ICD-10-CM | POA: Insufficient documentation

## 2021-10-27 DIAGNOSIS — Z79899 Other long term (current) drug therapy: Secondary | ICD-10-CM | POA: Diagnosis not present

## 2021-10-27 DIAGNOSIS — I493 Ventricular premature depolarization: Secondary | ICD-10-CM | POA: Diagnosis not present

## 2021-10-27 DIAGNOSIS — I428 Other cardiomyopathies: Secondary | ICD-10-CM | POA: Diagnosis not present

## 2021-10-27 DIAGNOSIS — E782 Mixed hyperlipidemia: Secondary | ICD-10-CM

## 2021-10-27 DIAGNOSIS — G4733 Obstructive sleep apnea (adult) (pediatric): Secondary | ICD-10-CM | POA: Insufficient documentation

## 2021-10-27 DIAGNOSIS — I129 Hypertensive chronic kidney disease with stage 1 through stage 4 chronic kidney disease, or unspecified chronic kidney disease: Secondary | ICD-10-CM | POA: Diagnosis not present

## 2021-10-27 DIAGNOSIS — E896 Postprocedural adrenocortical (-medullary) hypofunction: Secondary | ICD-10-CM | POA: Diagnosis not present

## 2021-10-27 DIAGNOSIS — D563 Thalassemia minor: Secondary | ICD-10-CM | POA: Diagnosis not present

## 2021-10-27 DIAGNOSIS — I5022 Chronic systolic (congestive) heart failure: Secondary | ICD-10-CM

## 2021-10-27 DIAGNOSIS — I2510 Atherosclerotic heart disease of native coronary artery without angina pectoris: Secondary | ICD-10-CM | POA: Diagnosis not present

## 2021-10-27 LAB — BASIC METABOLIC PANEL
Anion gap: 8 (ref 5–15)
BUN: 39 mg/dL — ABNORMAL HIGH (ref 6–20)
CO2: 23 mmol/L (ref 22–32)
Calcium: 9.8 mg/dL (ref 8.9–10.3)
Chloride: 107 mmol/L (ref 98–111)
Creatinine, Ser: 2.52 mg/dL — ABNORMAL HIGH (ref 0.61–1.24)
GFR, Estimated: 29 mL/min — ABNORMAL LOW (ref >60)
Glucose, Bld: 127 mg/dL — ABNORMAL HIGH (ref 70–99)
Potassium: 4.9 mmol/L (ref 3.5–5.1)
Sodium: 138 mmol/L (ref 135–145)

## 2021-10-27 LAB — LIPID PANEL
Cholesterol: 197 mg/dL (ref 0–200)
HDL: 47 mg/dL (ref >40)
LDL Cholesterol: 129 mg/dL — ABNORMAL HIGH (ref 0–99)
Total CHOL/HDL Ratio: 4.2 RATIO
Triglycerides: 106 mg/dL (ref <150)
VLDL: 21 mg/dL (ref 0–40)

## 2021-10-27 MED ORDER — CLONIDINE HCL 0.1 MG PO TABS: 0.0500 mg | ORAL_TABLET | Freq: Every day | ORAL | 3 refills | Status: DC

## 2021-10-27 NOTE — Patient Instructions (Addendum)
Decrease Clonidine to 0.'05mg'$  daily. Can stop after 2 weeks if SBP less than 130  Labs done today, your results will be available in MyChart, we will contact you for abnormal readings.  Your physician has requested that you have an echocardiogram. Echocardiography is a painless test that uses sound waves to create images of your heart. It provides your doctor with information about the size and shape of your heart and how well your heart's chambers and valves are working. This procedure takes approximately one hour. There are no restrictions for this procedure.  You have been referred to Dr. Nira Conn. Her office will call you to arrange your appointment    Your physician recommends that you schedule a follow-up appointment in: 5 months (November 2023) **Please call the office in September to arrange your follow up appointment **  If you have any questions or concerns before your next appointment please send [place] a message through [place] or call our office at [phone].    TO LEAVE A MESSAGE FOR THE NURSE SELECT OPTION 2, PLEASE LEAVE A MESSAGE INCLUDING: YOUR NAME DATE OF BIRTH CALL BACK NUMBER REASON FOR CALL**this is important as we prioritize the call backs  YOU WILL RECEIVE A CALL BACK THE SAME DAY AS LONG AS YOU CALL BEFORE 4:00 PM  At the [facility], you and your health needs are our priority. As part of our continuing mission to provide you with exceptional heart care, we have created designated Provider Care Teams. These Care Teams include your primary Cardiologist (physician) and Advanced Practice Providers (APPs- Physician Assistants and Nurse Practitioners) who all work together to provide you with the care you need, when you need it.   You may see any of the following providers on your designated Care Team at your next follow up: Dr Glori Bickers Dr Haynes Kerns, NP Lyda Jester, [place] [facility] [place], [place] Audry Riles,  PharmD   Please be sure to bring in all your medications bottles to every appointment.

## 2021-10-29 NOTE — Progress Notes (Signed)
PCP: Dr. Gerarda Fraction Cardiology: Dr. Domenic Polite HF Cardiology: Dr. Aundra Dubin  56 y.o. 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 Domenic Polite for evaluation of noncompaction.  Patient is a Surveyor, mining in [place].  He has had a number of chronic medical issues, main problems being long-standing HTN on multiple agents and CKD stage 3.  He is followed by Dr. Marval Regal for nephrology. He has had a chronic CK elevation (mild) and has been seen by rheumatology with no definite etiology.  He uses CPAP for OSA.  He has had episodes of chest pain in the past.  He has had 2 caths, 2013 and 9/22, both showed mild nonobstructive CAD.   More recently, he had an echo in 9/22 during workup of atypical chest pain that showed heavy LV apical and RV apical trabeculation concerning for noncompaction.  LV systolic function was normal, EF 60-65%.  This was followed up with a cardiac MRI that confirmed noncompaction by quantitative definition in the lateral wall. No contrast was used so delayed enhancement was not done.  Patient has history of PVCs, 9/22 Zio monitor showed 1.3% PVCs but no other arrhythmias.    In terms of family history, he reports that his father had a ruptured thoracic aortic aneurysm.  He had CTA chest in 8/22 with normal caliber aorta.  No FH of cardiomyopathy.   Patient was found to have an adrenal adenoma producing aldosterone.  He had left adrenalectomy in 5/23 at [facility].  He is now off amlodipine and spironolactone and has cut back on clonidine.   He is doing well post-op.  No dyspnea or chest pain.  BP is on the low side today, no lightheadedness. Stable BP.  He did not tolerate Coreg due to insomnia.   Labs (8/22): LDL 101 Labs (9/22): hgb 9.5, LFTs normal, K 4, creatinine 1.47, CK 424. Labs (5/23): K 4.4, creatinine 1.35  PMH: 1. Chest pain: LHC in 2013 with LIs.  LHC in 9/22  with nonobstructive CAD.  2. Hyperlipidemia 3. HTN: Long-standing. Found to have aldosterone-producing adrenal tumor, now s/p left adrenalectomy in 5/23.  4. CKD: stage 3 5. Thalassemia minor: Has periodic Fe infusions.  6. OSA: CPAP.  7. PVCs: 9/22 Zio monitor with 1.3% PVCs.  8. Chronic CK elevation: Mild. Had workup in the past with rheumatology, no definite cause found.  9. Noncompaction cardiomyopathy:  - Echo (9/22): LV EF 98-92%, grade 2 diastolic dysfunction, mild LVH, LV apex and RV with heavy trabeculation. Normal RV size and systolic function.  Moderate biatrial enlargement.  - Cardiac MRI (11/22): LV EF 66%, noncompaction in inferolateral and anterolateral walls by published criteria, RV EF 67%, normal atria.   FH: Father with HTN, had thoracic aortic aneurysm that ruptured.  Diabetes.   SH: Married, 4 children, lives in [place].  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  No smoking, rare ETOH.   ROS: All systems reviewed and negative except as per HPI.   Current Outpatient Medications  Medication Sig Dispense Refill   ACETAMINOPHEN EXTRA STRENGTH 500 MG tablet Take 1,000 mg by mouth as needed.     Azilsartan Medoxomil (EDARBI) 80 MG TABS Take 80 mg by mouth daily.     docusate sodium (COLACE) 100 MG capsule Take 200 mg by mouth daily as needed for constipation.     labetalol (NORMODYNE) 300 MG tablet Take 900 mg by mouth 2 (two) times daily.  Multiple Vitamin (MULTI-VITAMIN) tablet Take 1 tablet by mouth 3 (three) times a week.     Omega-3 Fatty Acids (FISH OIL OMEGA-3 PO) Take 1 tablet by mouth every morning. Medication is on hold     oxyCODONE (OXY IR/ROXICODONE) 5 MG immediate release tablet Take 5 mg by mouth as needed.     tadalafil (CIALIS) 5 MG tablet Take 5 mg by mouth as needed.     amLODipine (NORVASC) 10 MG tablet Take 10 mg by mouth at bedtime.  (Patient not taking: Reported on 10/27/2021)     cloNIDine (CATAPRES) 0.1 MG tablet Take 0.5 tablets (0.05 mg total)  by mouth at bedtime. 15 tablet 3   Potassium Chloride ER 20 MEQ TBCR Take 20 mEq by mouth in the morning and at bedtime. (Patient not taking: Reported on 10/27/2021)     spironolactone (ALDACTONE) 50 MG tablet Take 50 mg by mouth daily. (Patient not taking: Reported on 10/27/2021)     valACYclovir (VALTREX) 1000 MG tablet Take 1 tablet (1,000 mg total) by mouth daily. (Patient not taking: Reported on 10/27/2021) 30 tablet 11   No current facility-administered medications for this encounter.   BP 102/74   Pulse 84   Ht '5\' 9"'$  (1.753 m)   Wt 81.5 kg (179 lb 9.6 oz)   SpO2 100%   BMI 26.52 kg/m  General: NAD Neck: No JVD, no thyromegaly or thyroid nodule.  Lungs: Clear to auscultation bilaterally with normal respiratory effort. CV: Nondisplaced PMI.  Heart regular S1/S2, no S3/S4, no murmur.  No peripheral edema.  No carotid bruit.  Normal pedal pulses.  Abdomen: Soft, nontender, no hepatosplenomegaly, no distention.  Skin: Intact without lesions or rashes.  Neurologic: Alert and oriented x 3.  Psych: Normal affect. Extremities: No clubbing or cyanosis.  HEENT: Normal.   Assessment/Plan:  1. Noncompaction on echo and cMRI:  Patient has prominent trabeculation with deep intertrabecular recesses, fitting the definition of noncompaction particularly in the lateral wall by MRI. It is possible to get remodeling that looks like noncompaction with HTN and hematologic abnormalities like thalassemia, both of which the patient has.  On the cardiac MRI, contrast was not given so cannot comment on LGE.  This may be isolated LV noncompaction, but cannot rule out noncompaction phenotype due to loading conditions (HTN, anemia from thalassemia).  The LV EF is normal and there are no other significant abnormalities.  In terms of associated arrhythmias, he had PVCs on 9/22 Zio monitor but not extensive (1.3% beats).  - Consider genetic evaluation (LV noncompaction can be associated with known gene mutations) => will  refer to Lattie Corns our geneticist to see if genetic testing would be warranted.  However, in the absence of low EF and the presence of other potential causes of hypertrabeculation (HTN), may not be warranted.  - Would follow echo yearly for the next couple of years to make sure LV EF does not decline => repeat echo in 11/22.  - Unable to tolerate Coreg so remains on labetalol.  2. HTN: Secondary due to aldosterone-producing adrenal adenoma.  Now s/p left adrenalectomy. BP now on the low side, already off amlodipine and spironolactone.  - Continue to wean off clonidine, decrease to 0.05 mg daily then stop after a week or so.  If BP rebounds higher, would use amlodipine rather than restarting clonidine.  - Continue ARB.  - May be able to wean down labetalol in the future.   3. Elevated CK: Mild elevation, no explanation on  workup in past.  4. CAD: Mild, nonobstructive on 9/22 cath. LDL not particularly high.  Would avoid statin with chronic CK elevation (consider PCSK-9 or Leqvio if needed in future).  5. CKD: Stage 3, suspect hypertensive nephropathy.  - Would continue ARB.  - BMET today.  - Would recommend use of dapagliflozin in future.  Would try to start this after anti-hypertensive regimen is stabilized.   Followup in 11/22 with echo.   Loralie Champagne 10/29/2021

## 2021-10-31 ENCOUNTER — Telehealth (HOSPITAL_COMMUNITY): Payer: Self-pay

## 2021-10-31 DIAGNOSIS — I5022 Chronic systolic (congestive) heart failure: Secondary | ICD-10-CM

## 2021-10-31 NOTE — Telephone Encounter (Addendum)
Pt aware, agreeable, and verbalized understanding  Labs ordered and scheduled   ----- Message from Larey Dresser, MD sent at 10/29/2021  9:19 PM EDT ----- Creatinine significantly higher than prior.  Not sure what is driving this.  Increase hydration, avoid NSAIDs, needs repeat BMET in about a week.

## 2021-11-07 ENCOUNTER — Ambulatory Visit (HOSPITAL_COMMUNITY)
Admission: RE | Admit: 2021-11-07 | Discharge: 2021-11-07 | Disposition: A | Discharge status: Home / Self Care | Payer: 59 | Source: Ambulatory Visit | Attending: Internal Medicine | Admitting: Internal Medicine

## 2021-11-07 DIAGNOSIS — I5022 Chronic systolic (congestive) heart failure: Secondary | ICD-10-CM | POA: Diagnosis present

## 2021-11-07 LAB — BASIC METABOLIC PANEL
Anion gap: 5 (ref 5–15)
BUN: 28 mg/dL — ABNORMAL HIGH (ref 6–20)
CO2: 26 mmol/L (ref 22–32)
Calcium: 9.6 mg/dL (ref 8.9–10.3)
Chloride: 108 mmol/L (ref 98–111)
Creatinine, Ser: 2.28 mg/dL — ABNORMAL HIGH (ref 0.61–1.24)
GFR, Estimated: 33 mL/min — ABNORMAL LOW (ref >60)
Glucose, Bld: 73 mg/dL (ref 70–99)
Potassium: 4 mmol/L (ref 3.5–5.1)
Sodium: 139 mmol/L (ref 135–145)

## 2022-02-23 ENCOUNTER — Ambulatory Visit: Payer: Self-pay | Admitting: Nurse Practitioner

## 2022-02-23 ENCOUNTER — Encounter: Payer: Self-pay | Admitting: Nurse Practitioner

## 2022-02-23 DIAGNOSIS — B009 Herpesviral infection, unspecified: Secondary | ICD-10-CM

## 2022-02-23 DIAGNOSIS — Z113 Encounter for screening for infections with a predominantly sexual mode of transmission: Secondary | ICD-10-CM

## 2022-02-23 LAB — HM HIV SCREENING LAB: HM HIV Screening: NEGATIVE

## 2022-02-23 MED ORDER — ACYCLOVIR 800 MG PO TABS: 800.0000 mg | ORAL_TABLET | Freq: Every day | ORAL | 11 refills | Status: DC

## 2022-02-23 NOTE — Progress Notes (Signed)
[facility] Department STI clinic/screening visit  Subjective:  Devin Wilson is a 56 y.o. male being seen today for an STI screening visit. The patient reports they do not have symptoms.    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    Primary osteoarthritis of left knee    Stage 2 chronic kidney disease 01/13/2018   HSV-2 (herpes simplex virus 2) infection 12/20/2017   Medial meniscus, posterior horn derangement 02/20/2012   GERD (gastroesophageal reflux disease)    Chest pain on exertion 08/10/2011   Primary hypertension 08/10/2011   Unstable angina ([place]) 08/10/2011   Thalassemia minor    SPRAIN AND STRAIN OF METACARPOPHALANGEAL OF HAND 08/16/2009     Chief Complaint  Patient presents with   SEXUALLY TRANSMITTED DISEASE    Screening     HPI  Patient reports to clinic today for an STD screening and follow up for RPR testing.  Patient was positive for Syphilis 6 months ago.    Does the patient or their partner desires a pregnancy in the next year? No  Screening for MPX risk: Does the patient have an unexplained rash? No Is the patient MSM? No Does the patient endorse multiple sex partners or anonymous sex partners? Yes Did the patient have close or sexual contact with a person diagnosed with MPX? No Has the patient traveled outside the [place] where MPX is endemic? No Is there a high clinical suspicion for MPX-- evidenced by one of the following No  -Unlikely to be chickenpox  -Lymphadenopathy  -Rash that present in same phase of evolution on any given body  part   See flowsheet for further details and programmatic requirements.   Immunization History  Administered Date(s) Administered   Hepatitis A 11/06/2019, 11/10/2020   Influenza,inj,Quad PF,6+ Mos 02/08/2021     The following portions of the patient's history were reviewed and updated as appropriate: allergies, current medications, past medical history, past social history, past surgical history and problem list.  Objective:  There were no vitals filed for this visit.  Physical Exam Constitutional:      Appearance: Normal appearance.  HENT:     Head: Normocephalic. No abrasion, masses or laceration. Hair is normal.     Right Ear: External ear normal.     Left Ear: External ear normal.     Nose: Nose normal.     Mouth/Throat:     Lips: Pink.     Mouth: Mucous membranes are moist. No oral lesions.     Pharynx: No pharyngeal swelling, oropharyngeal exudate, posterior oropharyngeal erythema or uvula swelling.     Tonsils: No tonsillar exudate or tonsillar abscesses.     Comments: No visible signs of dental caries  Eyes:     General: Lids are normal.        Right eye: No discharge.        Left eye: No discharge.     Conjunctiva/sclera: Conjunctivae normal.     Right eye: No exudate.    Left eye: No exudate. Abdominal:     General: Abdomen is  flat.     Palpations: Abdomen is soft.     Tenderness: There is no abdominal tenderness. There is no rebound.  Genitourinary:    Pubic Area: No rash or pubic lice.      Penis: Normal and uncircumcised. No erythema or discharge.      Testes: Normal.        Right: Mass or tenderness not present.        Left: Mass or tenderness not present.     Rectum: Normal.     Comments: Discharge amount: none Color: none  Musculoskeletal:     Cervical back: Full passive range of motion without pain, normal range of motion and neck supple.  Lymphadenopathy:     Cervical: No cervical adenopathy.     Right cervical: No superficial, deep or  posterior cervical adenopathy.    Left cervical: No superficial, deep or posterior cervical adenopathy.     Upper Body:     Right upper body: No supraclavicular, axillary or epitrochlear adenopathy.     Left upper body: No supraclavicular, axillary or epitrochlear adenopathy.     Lower Body: No right inguinal adenopathy. No left inguinal adenopathy.  Skin:    General: Skin is warm and dry.     Findings: No lesion or rash.  Neurological:     Mental Status: He is alert and oriented to person, place, and time.  Psychiatric:        Attention and Perception: Attention normal.        Mood and Affect: Mood normal.        Speech: Speech normal.        Behavior: Behavior normal. Behavior is cooperative.       Assessment and Plan:  Devin Wilson is a 56 y.o. male presenting to the [facility] Department for STI screening   1. HSV-2 (herpes simplex virus 2) infection -Patient desires a prescription refill on HSV medication.  Prescription submitted.  - acyclovir (ZOVIRAX) 800 MG tablet; Take 1 tablet (800 mg total) by mouth daily.  Dispense: 30 tablet; Refill: 11   2. Screening examination for venereal disease -56 year old male in clinic today for STD screening. -Patient does not have STI symptoms Patient accepted all screenings including oral GC, urine CT/GC and bloodwork for HIV/RPR.  Patient meets criteria for HepB screening? No. Ordered? No - low risk  Patient meets criteria for HepC screening? No. Ordered? No - low risk Recommended condom use with all sex Discussed importance of condom use for STI prevent  Discussed time line for State Lab results and that patient will be called with positive results and encouraged patient to call if he had not heard in 2 weeks Recommended returning for continued or worsening symptoms.    - Syphilis Serology, [place] [facility] - HIV [facility] - Chlamydia/GC NAA, Confirmation - Gonococcus culture   Total time spent: 30 minutes    Return if symptoms worsen or fail to improve.  Future Appointments  Date Time Provider [place]  03/06/2022  9:00 AM Debbe Mounts, PhD [facility]    Gregary Cromer, FNP

## 2022-02-27 LAB — SPECIMEN STATUS REPORT

## 2022-02-27 LAB — CHLAMYDIA/GC NAA, CONFIRMATION
Chlamydia trachomatis, NAA: NEGATIVE
Neisseria gonorrhoeae, NAA: NEGATIVE

## 2022-02-27 LAB — GONOCOCCUS CULTURE

## 2022-03-05 ENCOUNTER — Telehealth: Payer: Self-pay

## 2022-03-05 NOTE — Telephone Encounter (Signed)
See Lorella Nimrod, FNP, written order on syphilis TR dated 03/05/22.  02/23/22 1:32 syphilis titer trending down, follow-up in 6 months.  Call pt to inform of result and follow-up recommendation.

## 2022-03-06 ENCOUNTER — Encounter: Payer: 59 | Admitting: Genetic Counselor

## 2022-03-07 NOTE — Telephone Encounter (Signed)
Phone call to pt and pt confirmed identity. Discussed titer results with pt and needs follow-up testing in 6 months.  Pt expressed understanding.

## 2022-03-12 ENCOUNTER — Telehealth: Payer: Self-pay

## 2022-04-02 NOTE — Telephone Encounter (Signed)
Pt appointment for STI screening. Seen by FNP White.

## 2022-04-08 ENCOUNTER — Encounter (INDEPENDENT_AMBULATORY_CARE_PROVIDER_SITE_OTHER): Payer: Self-pay | Admitting: Gastroenterology

## 2022-08-09 ENCOUNTER — Ambulatory Visit: Payer: Self-pay | Admitting: Family Medicine

## 2022-08-09 DIAGNOSIS — Z8619 Personal history of other infectious and parasitic diseases: Secondary | ICD-10-CM | POA: Insufficient documentation

## 2022-08-09 DIAGNOSIS — Z113 Encounter for screening for infections with a predominantly sexual mode of transmission: Secondary | ICD-10-CM

## 2022-08-09 LAB — HM HIV SCREENING LAB: HM HIV Screening: NEGATIVE

## 2022-08-09 NOTE — Progress Notes (Signed)
[facility] Department STI clinic/screening visit  Subjective:  Devin Wilson is a 57 y.o. male being seen today for an STI screening visit. The patient reports they do not have symptoms.    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   Last HIV test per patient/review of record was  Lab Results  Component Value Date   HMHIVSCREEN Negative - Validated 02/23/2022    Lab Results  Component Value Date   HIV Non Reactive 02/07/2021    Does the patient or their partner desires a pregnancy in the next year? No  Screening for MPX risk: Does the patient have an unexplained rash? No Is the patient MSM? No Does the patient endorse multiple sex partners or anonymous sex partners? No Did the patient have close or sexual contact with a person diagnosed with MPX? No Has the patient traveled outside the [place] where MPX is endemic? No Is there a high clinical suspicion for MPX-- evidenced by one of the following No  -Unlikely to be chickenpox  -Lymphadenopathy  -Rash that present in same  phase of evolution on any given body part   See flowsheet for further details and programmatic requirements.   Immunization History  Administered Date(s) Administered   Hepatitis A 11/06/2019, 11/10/2020   Influenza,inj,Quad PF,6+ Mos 02/08/2021     The following portions of the patient's history were reviewed and updated as appropriate: allergies, current medications, past medical history, past social history, past surgical history and problem list.  Objective:  There were no vitals filed for this visit.  Physical Exam Vitals and nursing note reviewed.  Constitutional:      Appearance: Normal appearance.  HENT:     Head: Normocephalic and atraumatic.     Mouth/Throat:     Mouth: Mucous membranes are moist.     Pharynx: No oropharyngeal exudate or posterior oropharyngeal erythema.  Eyes:     General:        Right eye: No discharge.        Left eye: No discharge.     Conjunctiva/sclera:     Right eye: Right conjunctiva is not injected. No exudate.    Left eye: Left conjunctiva is not injected. No exudate. Pulmonary:     Effort: Pulmonary effort is normal.  Abdominal:     General: Abdomen is flat.     Palpations: Abdomen is soft. There is no hepatomegaly or mass.     Tenderness: There is no abdominal tenderness. There is no rebound.  Genitourinary:  Comments: Declined genital exam- asymptomatic Lymphadenopathy:     Cervical: No cervical adenopathy.     Upper Body:     Right upper body: No supraclavicular or axillary adenopathy.     Left upper body: No supraclavicular or axillary adenopathy.  Skin:    General: Skin is warm and dry.  Neurological:     Mental Status: He is alert and oriented to person, place, and time.       Assessment and Plan:  Devin Wilson is a 57 y.o. male presenting to the [facility] Department for STI screening  1. Screening for venereal disease  - HIV [place] [facility] - Syphilis Serology, [place] [facility] - Chlamydia/GC NAA,  Confirmation   Patient does not have STI symptoms Patient accepted all screenings including  urine GC/Chlamydia, and blood work for HIV/Syphilis. Patient meets criteria for HepB screening? No. Ordered? not applicable Patient meets criteria for HepC screening? No. Ordered? not applicable Recommended condom use with all sex Discussed importance of condom use for STI prevent  Treat positive test results per standing order. Discussed time line for State Lab results and that patient will be called with positive results and encouraged patient to call if he had not heard in 2 weeks Recommended repeat testing in 3 months with positive results. Recommended returning for continued or worsening symptoms.   Return if symptoms worsen or fail to improve.  Total time spent 20 minutes  Sharlet Sal[place], [place]

## 2022-08-13 LAB — GONOCOCCUS CULTURE

## 2022-08-13 LAB — CHLAMYDIA/GC NAA, CONFIRMATION
Chlamydia trachomatis, NAA: NEGATIVE
Neisseria gonorrhoeae, NAA: NEGATIVE

## 2022-08-27 ENCOUNTER — Telehealth: Payer: Self-pay

## 2022-08-27 NOTE — Telephone Encounter (Signed)
LM for pt to return my call

## 2022-08-31 ENCOUNTER — Telehealth: Payer: Self-pay

## 2022-08-31 NOTE — Telephone Encounter (Signed)
Pt notified of Syphilis results and the need for further treatment.  Pt is requesting Doxycycline due to traveling out of the [place] and being unable to have Bicillin for 3 weeks.  Pt notified that a Provider will send an Rx to his Pharmacy

## 2022-09-03 ENCOUNTER — Other Ambulatory Visit: Payer: Self-pay | Admitting: Family Medicine

## 2022-09-03 DIAGNOSIS — Z8619 Personal history of other infectious and parasitic diseases: Secondary | ICD-10-CM

## 2022-09-03 MED ORDER — DOXYCYCLINE HYCLATE 100 MG PO TABS: 100.0000 mg | ORAL_TABLET | Freq: Two times a day (BID) | ORAL | 0 refills | Status: DC

## 2022-09-10 NOTE — Addendum Note (Signed)
Addended by: Heywood Bene on: 09/10/2022 01:29 PM   Modules accepted: Orders

## 2023-02-19 ENCOUNTER — Other Ambulatory Visit (HOSPITAL_COMMUNITY): Payer: Self-pay | Admitting: Cardiology

## 2023-02-19 DIAGNOSIS — I5022 Chronic systolic (congestive) heart failure: Secondary | ICD-10-CM

## 2023-03-28 ENCOUNTER — Other Ambulatory Visit: Payer: Self-pay | Admitting: Nurse Practitioner

## 2023-03-28 DIAGNOSIS — B009 Herpesviral infection, unspecified: Secondary | ICD-10-CM

## 2023-04-10 ENCOUNTER — Telehealth: Payer: Self-pay | Admitting: Internal Medicine

## 2023-04-10 ENCOUNTER — Other Ambulatory Visit: Payer: Self-pay | Admitting: Family Medicine

## 2023-04-10 DIAGNOSIS — B009 Herpesviral infection, unspecified: Secondary | ICD-10-CM

## 2023-04-10 MED ORDER — ACYCLOVIR 800 MG PO TABS: 800.0000 mg | ORAL_TABLET | Freq: Every day | ORAL | 1 refills | Status: DC

## 2023-04-10 NOTE — Progress Notes (Signed)
Patient called and asked for a 1 time refill of Acyclovir.  Sent into pharmacy requested.   [facility] FNP-C

## 2023-04-10 NOTE — Telephone Encounter (Signed)
wants to know if the Rx that was sent to [facility] in [place] can be sent to [address] in [place] ([facility])

## 2023-05-06 ENCOUNTER — Encounter: Payer: Self-pay | Admitting: Nurse Practitioner

## 2023-05-06 ENCOUNTER — Ambulatory Visit: Payer: Self-pay | Admitting: Nurse Practitioner

## 2023-05-06 DIAGNOSIS — B009 Herpesviral infection, unspecified: Secondary | ICD-10-CM

## 2023-05-06 DIAGNOSIS — Z113 Encounter for screening for infections with a predominantly sexual mode of transmission: Secondary | ICD-10-CM

## 2023-05-06 LAB — HM HIV SCREENING LAB: HM HIV Screening: NEGATIVE

## 2023-05-06 MED ORDER — ACYCLOVIR 800 MG PO TABS: 800.0000 mg | ORAL_TABLET | Freq: Every day | ORAL | 6 refills | Status: DC

## 2023-05-06 NOTE — Progress Notes (Signed)
[facility] Department STI clinic/screening visit  Subjective:  Devin Wilson is a 57 y.o. male being seen today for an STI screening visit. The patient reports they do not have symptoms.    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 Pt c/o of having no symptoms    Patient is a pleasant 57 y.o. male who presents to the clinic today for asymptomatic STI testing and medication refill. He reports 6 male partners in the last 2 months and practices oral and penile/vaginal penetrative sex. He indicates condom use with every sexual encounter. Last sexual encounter was 04/29/23. Patient indicates a history of gonorrhea, syphilis, and HSV.     Last HIV test per patient/review of record was  Lab Results  Component Value Date   HMHIVSCREEN Negative - Validated 08/09/2022    Lab Results  Component Value Date   HIV Non Reactive 02/07/2021    Last HEPC test per patient/review of record was  Lab Results  Component Value Date   HMHEPCSCREEN Negative-Validated 02/11/2020   No  components found for: "HEPC"   Last HEPB test per patient/review of record was No components found for: "HMHEPBSCREEN" No components found for: "HEPC"   Does the patient or their partner desires a pregnancy in the next year? No  Screening for MPX risk: Does the patient have an unexplained rash? No Is the patient MSM? No Does the patient endorse multiple sex partners or anonymous sex partners? Yes Did the patient have close or sexual contact with a person diagnosed with MPX? No Has the patient traveled outside the [place] where MPX is endemic? No Is there a high clinical suspicion for MPX-- evidenced by one of the following No  -Unlikely to be chickenpox  -Lymphadenopathy  -Rash that present in same phase of evolution on any given body part   See flowsheet for further details and programmatic requirements.   Immunization History  Administered Date(s) Administered   Hepatitis A 11/06/2019, 11/10/2020   Influenza,inj,Quad PF,6+ Mos 02/08/2021     The following portions of the patient's history were reviewed and updated as appropriate: allergies, current medications, past medical history, past social history, past surgical history and problem list.  Objective:  There were no vitals filed for this visit.  Physical Exam Nursing note reviewed.  Constitutional:      Appearance: Normal appearance.  HENT:     Head: Normocephalic.     Salivary Glands: Right salivary gland is not diffusely enlarged or tender. Left salivary gland is not diffusely enlarged or  tender.     Mouth/Throat:     Lips: Pink. No lesions.     Mouth: Mucous membranes are moist.     Tongue: No lesions. Tongue does not deviate from midline.     Pharynx: Uvula midline. No oropharyngeal exudate or posterior oropharyngeal erythema.     Tonsils: No tonsillar exudate.  Eyes:     General:        Right eye: No discharge.        Left eye: No discharge.     Conjunctiva/sclera:     Right eye: Right conjunctiva is not  injected. No exudate.    Left eye: Left conjunctiva is not injected. No exudate. Pulmonary:     Effort: Pulmonary effort is normal.  Genitourinary:    Comments: Declined genital exam- asymptomatic Lymphadenopathy:     Head:     Right side of head: No submental, submandibular, tonsillar, preauricular or posterior auricular adenopathy.     Left side of head: No submental, submandibular, tonsillar, preauricular or posterior auricular adenopathy.     Cervical: No cervical adenopathy.     Right cervical: No superficial or posterior cervical adenopathy.    Left cervical: No superficial or posterior cervical adenopathy.     Upper Body:     Right upper body: No supraclavicular or axillary adenopathy.     Left upper body: No supraclavicular or axillary adenopathy.  Skin:    General: Skin is warm and dry.  Neurological:     Mental Status: He is alert and oriented to person, place, and time.  Psychiatric:        Attention and Perception: Attention normal.        Mood and Affect: Mood and affect normal.        Speech: Speech normal.        Behavior: Behavior normal. Behavior is cooperative.        Thought Content: Thought content normal.    Assessment and Plan:  Devin Wilson is a 56 y.o. male presenting to the [facility] Department for STI screening  1. Screening for venereal disease  - HIV [place] [facility] - Syphilis Serology, [place] [facility] - Chlamydia/GC NAA, Confirmation - Gonococcus culture  2. HSV-2 (herpes simplex virus 2) infection Patient indicates taking acyclovir once daily as suppression therapy for HSV. He reports his last outbreak was 1 month ago and usually has an outbreak about 4 times per year.   - acyclovir (ZOVIRAX) 800 MG tablet; Take 1 tablet (800 mg total) by mouth daily.  Dispense: 30 tablet; Refill: 6  Patient does not have STI symptoms Patient accepted all screenings including  urine GC/Chlamydia, and blood work for HIV/Syphilis. Patient meets criteria  for HepB screening? No. Ordered? not applicable Patient meets criteria for HepC screening? No. Ordered? not applicable Recommended condom use with all sex Discussed importance of condom use for STI prevent  Treat positive test results per standing order. Discussed time line for State Lab results and that patient will be called with positive results and encouraged patient to call if he had not heard in 2 weeks Recommended repeat testing in 3 months with positive results. Recommended returning for continued or worsening symptoms.   Return if symptoms worsen or fail to improve.  Future Appointments  Date Time Provider Department Center  05/10/2023  2:00 PM [facility] ECHO OP 1 [facility]  05/10/2023  3:20 PM Laurey Morale, MD [facility] None   Total time with patient 30 minutes.  Edmonia James, NP

## 2023-05-06 NOTE — Progress Notes (Signed)
  Pt is here for STD screening and medication refill. Condoms declined and patient given the opportunity to ask questions for any clarifications. Sonda Primes, RN.

## 2023-05-10 ENCOUNTER — Ambulatory Visit (HOSPITAL_COMMUNITY)
Admission: RE | Admit: 2023-05-10 | Discharge: 2023-05-10 | Disposition: A | Discharge status: Home / Self Care | Payer: Self-pay | Source: Ambulatory Visit | Attending: Cardiology | Admitting: Cardiology

## 2023-05-10 ENCOUNTER — Encounter (HOSPITAL_COMMUNITY): Payer: Self-pay | Admitting: Cardiology

## 2023-05-10 VITALS — BP 114/78 | HR 71 | Wt 188.4 lb

## 2023-05-10 DIAGNOSIS — I5022 Chronic systolic (congestive) heart failure: Secondary | ICD-10-CM | POA: Insufficient documentation

## 2023-05-10 DIAGNOSIS — N183 Chronic kidney disease, stage 3 unspecified: Secondary | ICD-10-CM | POA: Insufficient documentation

## 2023-05-10 DIAGNOSIS — Z79899 Other long term (current) drug therapy: Secondary | ICD-10-CM | POA: Insufficient documentation

## 2023-05-10 DIAGNOSIS — G4733 Obstructive sleep apnea (adult) (pediatric): Secondary | ICD-10-CM | POA: Insufficient documentation

## 2023-05-10 DIAGNOSIS — I130 Hypertensive heart and chronic kidney disease with heart failure and stage 1 through stage 4 chronic kidney disease, or unspecified chronic kidney disease: Secondary | ICD-10-CM | POA: Insufficient documentation

## 2023-05-10 DIAGNOSIS — R0789 Other chest pain: Secondary | ICD-10-CM

## 2023-05-10 DIAGNOSIS — E785 Hyperlipidemia, unspecified: Secondary | ICD-10-CM | POA: Insufficient documentation

## 2023-05-10 DIAGNOSIS — I2510 Atherosclerotic heart disease of native coronary artery without angina pectoris: Secondary | ICD-10-CM | POA: Insufficient documentation

## 2023-05-10 LAB — ECHOCARDIOGRAM COMPLETE
AR max vel: 2.46 cm2
AV Area VTI: 2.69 cm2
AV Area mean vel: 2.46 cm2
AV Mean grad: 3.5 mm[Hg]
AV Peak grad: 7.1 mm[Hg]
Ao pk vel: 1.34 m/s
Area-P 1/2: 3.72 cm2
S' Lateral: 2.7 cm

## 2023-05-10 NOTE — Patient Instructions (Addendum)
Great to see you today!!!  Labs are needed, we have given you a prescription to have this done locally  CT Coronary Calcium Score:  Your physician has recommended that you have CT Coronary Calcium Score. - $99 out of pocket cost at the time of your test  Your physician recommends that you schedule a follow-up appointment in: 1 year (Dec 2025), **PLEASE CALL OUR OFFICE IN OCTOBER TO SCHEDULE THIS APPOINTMENT  If you have any questions or concerns before your next appointment please send [place] a message through [place] or call our office at [phone].    TO LEAVE A MESSAGE FOR THE NURSE SELECT OPTION 2, PLEASE LEAVE A MESSAGE INCLUDING: YOUR NAME DATE OF BIRTH CALL BACK NUMBER REASON FOR CALL**this is important as we prioritize the call backs  YOU WILL RECEIVE A CALL BACK THE SAME DAY AS LONG AS YOU CALL BEFORE 4:00 PM  At the [facility], you and your health needs are our priority. As part of our continuing mission to provide you with exceptional heart care, we have created designated Provider Care Teams. These Care Teams include your primary Cardiologist (physician) and Advanced Practice Providers (APPs- Physician Assistants and Nurse Practitioners) who all work together to provide you with the care you need, when you need it.   You may see any of the following providers on your designated Care Team at your next follow up: Dr Arvilla Meres Dr Marca Ancona Dr. Dorthula Nettles Dr. Clearnce Hasten Amy Filbert Schilder, NP Robbie Lis, [facility] [place], [place] Brynda Peon, NP [place] Lee, NP Karle Plumber, PharmD   Please be sure to bring in all your medications bottles to every appointment.    Thank you for choosing [facility]

## 2023-05-11 LAB — CHLAMYDIA/GC NAA, CONFIRMATION
Chlamydia trachomatis, NAA: NEGATIVE
Neisseria gonorrhoeae, NAA: NEGATIVE

## 2023-05-11 LAB — GONOCOCCUS CULTURE

## 2023-05-12 NOTE — Progress Notes (Signed)
PCP: Dr. Sherwood Gambler Cardiology: Dr. Diona Browner HF Cardiology: Dr. Shirlee Latch  57 y.o. 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 Diona Browner for evaluation of noncompaction.  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  He has had a number of chronic medical issues, main problems being long-standing HTN on multiple agents and CKD stage 3.  He is followed by Dr. Arrie Aran for nephrology. He has had a chronic CK elevation (mild) and has been seen by rheumatology with no definite etiology.  He uses CPAP for OSA.  He has had episodes of chest pain in the past.  He has had 2 caths, 2013 and 9/22, both showed mild nonobstructive CAD.   More recently, he had an echo in 9/22 during workup of atypical chest pain that showed heavy LV apical and RV apical trabeculation concerning for noncompaction.  LV systolic function was normal, EF 60-65%.  This was followed up with a cardiac MRI that confirmed noncompaction by quantitative definition in the lateral wall. No contrast was used so delayed enhancement was not done.  Patient has history of PVCs, 9/22 Zio monitor showed 1.3% PVCs but no other arrhythmias.    In terms of family history, he reports that his father had a ruptured thoracic aortic aneurysm.  He had CTA chest in 8/22 with normal caliber aorta.  No FH of cardiomyopathy.   Patient was found to have an adrenal adenoma producing aldosterone.  He had left adrenalectomy in 5/23 at [facility].    Echo was done today and reviewed, EF 60-65%, normal RV, IVC normal.   He returns for followup of LV noncompaction and HTN. He is off all anti-hypertensive except for labetalol.  Sees Dr. Arrie Aran for CKD. No exertional dyspnea, can jog for exercise with no problems. No chest pain.  No palpitations.    Labs (8/22): LDL 101 Labs (9/22): hgb 9.5, LFTs normal, K 4, creatinine 1.47, CK 424. Labs (5/23): K 4.4,  creatinine 1.35 Labs (6/23): LDL 129, K 4, creatinine 2.28  PMH: 1. Chest pain: LHC in 2013 with LIs.  LHC in 9/22 with nonobstructive CAD.  2. Hyperlipidemia 3. HTN: Long-standing. Found to have aldosterone-producing adrenal tumor, now s/p left adrenalectomy in 5/23.  4. CKD: stage 3 5. Thalassemia minor: Has periodic Fe infusions.  6. OSA: CPAP.  7. PVCs: 9/22 Zio monitor with 1.3% PVCs.  8. Chronic CK elevation: Mild. Had workup in the past with rheumatology, no definite cause found.  9. LV noncompaction:  - Echo (9/22): LV EF 60-65%, grade 2 diastolic dysfunction, mild LVH, LV apex and RV with heavy trabeculation. Normal RV size and systolic function.  Moderate biatrial enlargement.  - Cardiac MRI (11/22): LV EF 66%, noncompaction in inferolateral and anterolateral walls by published criteria, RV EF 67%, normal atria.  - Echo (12/24): EF 60-65%, normal RV, IVC normal.   FH: Father with HTN, had thoracic aortic aneurysm that ruptured.  Diabetes.   SH: Married, 4 children, lives in [place].  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  No smoking, rare ETOH.   ROS: All systems reviewed and negative except as per HPI.   Current Outpatient Medications  Medication Sig Dispense Refill   ACETAMINOPHEN EXTRA STRENGTH 500 MG tablet Take 1,000 mg by mouth as needed.     acyclovir (ZOVIRAX) 800 MG tablet Take 1 tablet (800 mg total) by mouth daily. 30 tablet 6   labetalol (NORMODYNE) 300 MG tablet Take 150  mg by mouth 2 (two) times daily.     Multiple Vitamin (MULTI-VITAMIN) tablet Take 1 tablet by mouth 3 (three) times a week.     Omega-3 Fatty Acids (FISH OIL OMEGA-3 PO) Take 1 tablet by mouth every morning. Medication is on hold     tadalafil (CIALIS) 5 MG tablet Take 5 mg by mouth as needed.     No current facility-administered medications for this encounter.   BP 114/78   Pulse 71   Wt 85.5 kg (188 lb 6.4 oz)   SpO2 98%   BMI 27.82 kg/m  General: NAD Neck: No JVD, no thyromegaly  or thyroid nodule.  Lungs: Clear to auscultation bilaterally with normal respiratory effort. CV: Nondisplaced PMI.  Heart regular S1/S2, no S3/S4, no murmur.  No peripheral edema.  No carotid bruit.  Normal pedal pulses.  Abdomen: Soft, nontender, no hepatosplenomegaly, no distention.  Skin: Intact without lesions or rashes.  Neurologic: Alert and oriented x 3.  Psych: Normal affect. Extremities: No clubbing or cyanosis.  HEENT: Normal.   Assessment/Plan:  1. 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 It is possible to get remodeling that looks like noncompaction with HTN and hematologic abnormalities like thalassemia, both of which the patient has.  On the cardiac MRI, contrast was not given so cannot comment on LGE.  This may be isolated LV noncompaction, but cannot rule out noncompaction phenotype due to loading conditions (HTN, anemia from thalassemia).  The LV EF is normal and there are no other significant abnormalities.  Repeat echo done today showed normal EF.  In terms of associated arrhythmias, he had PVCs on 9/22 Zio monitor but not extensive (1.3% beats).  - Would follow echo yearly for the next couple of years to make sure LV EF does not decline => repeat echo in 12/25 2. HTN: Secondary due to aldosterone-producing adrenal adenoma.  Now s/p left adrenalectomy. BP now controlled, he is only on labetalol.   - Continue labetalol 150 mg bid.  3. Elevated CK: Mild elevation, no explanation on workup in past.  4. CAD: Mild, nonobstructive on 9/22 cath.  Would avoid statin with chronic CK elevation (consider PCSK-9 or Leqvio if needed in future).  LDL 129 in 6/23.  - Check lipids today.  - I will arrange for coronary artery calcium scoring for risk stratification.  5. CKD: Stage 3, suspect hypertensive nephropathy.  - Follows with nephrology,  would consider use of ARB and/or SLGT2 inhibitor.   Followup in 1 year.   Marca Ancona 05/12/2023

## 2023-08-05 LAB — LAB REPORT - SCANNED
Albumin, Urine POC: <3
Albumin/Creatinine Ratio, Urine, POC: <2
Creatinine, POC: 124.3 mg/dL
EGFR: 39

## 2024-01-29 ENCOUNTER — Other Ambulatory Visit: Payer: Self-pay | Admitting: Nurse Practitioner

## 2024-01-29 DIAGNOSIS — B009 Herpesviral infection, unspecified: Secondary | ICD-10-CM

## 2024-02-21 ENCOUNTER — Ambulatory Visit: Payer: Self-pay

## 2024-03-11 ENCOUNTER — Encounter (INDEPENDENT_AMBULATORY_CARE_PROVIDER_SITE_OTHER): Payer: Self-pay | Admitting: Gastroenterology

## 2024-03-13 ENCOUNTER — Ambulatory Visit: Payer: Self-pay

## 2024-03-13 DIAGNOSIS — B009 Herpesviral infection, unspecified: Secondary | ICD-10-CM

## 2024-03-13 DIAGNOSIS — Z113 Encounter for screening for infections with a predominantly sexual mode of transmission: Secondary | ICD-10-CM

## 2024-03-13 LAB — HM HIV SCREENING LAB: HM HIV Screening: NEGATIVE

## 2024-03-13 MED ORDER — VALACYCLOVIR HCL 500 MG PO TABS: 500.0000 mg | ORAL_TABLET | Freq: Every day | ORAL | 3 refills | Status: AC

## 2024-03-13 NOTE — Progress Notes (Signed)
 [facility] Department STI clinic [address], [address] [place] [address] Main phone: [phone]  STI screening visit  Subjective:  Devin Wilson is a 58 y.o. male being seen today for an STI screening visit. The patient reports they do not have symptoms.    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 No known contacts. He does have a history of HSV -  was taking acyclovir  800 mg for episodic therapy but has found he has to take it frequently, most days. Would like to transition to suppressive therapy.   See flowsheet for further details and programmatic requirements  Hyperlink available at the top of the signed note in blue.  Flow sheet content below:  Pregnancy Intention Screening Does the patient want to become pregnant in the next year?: N/A Does the patient's partner want to become pregnant in the next year?: No Would the patient like to discuss contraceptive options today?: N/A Reason For STD Screen STD Screening: Is asymptomatic Have you ever  had an STD?: Yes STD Symptoms Denies all: Yes Risk Factors for Hep B Household, sexual, or needle sharing contact of a person infected with Hep B: No Sexual contact with a person who uses drugs not as prescribed?: No Currently or Ever used drugs not as prescribed: No HIV Positive: No PRep Patient: No Men who have sex with men: No Have Hepatitis C: No History of Incarceration: No History of Homeslessness?: No Anal sex following anal drug use?: No Risk Factors for Hep C Currently using drugs not as prescribed: No Sexual partner(s) currently using drugs as not prescribed: No History of drug use: No HIV Positive: No People with a history of incarceration: No People born between the years of 42 and 30: No Counseling Patient counseled to use condoms with all sex: Condoms given RTC in 2-3 weeks for test results: Yes Clinic will call if test results abnormal before test result appt.: Yes Test results given to patient Patient counseled to use condoms with all sex: Condoms given  Screening for MPX risk:  Unexplained rash?  No   MSM?  No   Multiple or anonymous sex partners?  No   Any close or sexual contact with a person  diagnosed with MPX?  No   Any outside the [place]  where MPX is endemic?  No   High clinical suspicion for MPX?    -Unlikely to be chickenpox    -Lymphadenopathy    -Rash that presents in same phase of       evolution on any given  body part  No   STI screening history: Last HIV test per patient/review of record was  Lab Results  Component Value Date   HMHIVSCREEN Negative - Validated 05/06/2023    Lab Results  Component Value Date   HIV Non Reactive 02/07/2021    Last HEPC test per patient/review of record was  Lab Results  Component Value Date   HMHEPCSCREEN Negative-Validated 02/11/2020   No components found for: HEPC   Last HEPB test per patient/review of record was No components found for: HMHEPBSCREEN   Fertility: Does the patient or their  partner desires a pregnancy in the next year? No  Immunization History  Administered Date(s) Administered   Hepatitis A 11/06/2019, 11/10/2020   Influenza,inj,Quad PF,6+ Mos 02/08/2021    The following portions of the patient's history were reviewed and updated as appropriate: allergies, current medications, past medical history, past social history, past surgical history and problem list.  Objective:  There were no vitals filed for this visit.  Physical Exam Vitals and nursing note reviewed.  Constitutional:      Appearance: Normal appearance.  HENT:     Head: Normocephalic and atraumatic.     Mouth/Throat:     Mouth: Mucous membranes are moist.     Pharynx: No oropharyngeal exudate or posterior oropharyngeal erythema.  Eyes:     General:        Right eye: No discharge.        Left eye: No discharge.     Conjunctiva/sclera:     Right eye: Right conjunctiva is not injected. No exudate.    Left eye: Left conjunctiva is not injected. No exudate. Pulmonary:     Effort: Pulmonary effort is normal.  Abdominal:     General: Abdomen is flat.     Palpations: Abdomen is soft. There is no hepatomegaly or mass.     Tenderness: There is no abdominal tenderness. There is no rebound.  Genitourinary:    Comments: Declined genital exam- asymptomatic Lymphadenopathy:     Cervical: No cervical adenopathy.     Upper Body:     Right upper body: No supraclavicular or axillary adenopathy.     Left upper body: No supraclavicular or axillary adenopathy.  Skin:    General: Skin is warm and dry.  Neurological:     Mental Status: He is alert and oriented to person, place, and time.    Assessment and Plan:  Devin Wilson is a 58 y.o. male presenting to the [facility] Department for STI screening  1. Screening for venereal disease (Primary)  - Gonococcus culture - HIV [facility] - Syphilis Serology, [facility] - Chlamydia/GC NAA, Confirmation  2. HSV-2 (herpes simplex  virus 2) infection  - Followed by nephrology for CKD - per patient, most recent creatinine was 1.9 - Encouraged discussion of suppressive therapy and his kidney function w/ his nephrologist - Consulted Dr. Macario regarding safety of this dose - valACYclovir  (VALTREX ) 500 MG tablet; Take 1 tablet (500 mg total) by mouth daily.  Dispense: 90 tablet; Refill: 3   Patient does not have STI symptoms Patient accepted the following screenings: oral GC culture, urine g/c, HIV, and RPR Patient meets criteria for HepB screening? No. Ordered? no Patient meets criteria for HepC screening? No. Ordered? no Recommended condom use with all sex Discussed importance of condom use for STI prevention  Treat positive test results per standing order. Discussed time line for [facility] results and that patient will be called  with positive results and encouraged patient to call if he had not heard in 2 weeks Recommended repeat testing in 3 months with positive results. Recommended returning for continued or worsening symptoms.   Return in about 3 months (around 06/13/2024).  No future appointments.  Damien FORBES Satchel, NP

## 2024-03-13 NOTE — Progress Notes (Signed)
 Pt is here for STD screening. Condoms given and pt has advised to pick up prescription medication from the Pharmacy. Wilkie Drought, RN.

## 2024-03-16 LAB — CHLAMYDIA/GC NAA, CONFIRMATION
Chlamydia trachomatis, NAA: NEGATIVE
Neisseria gonorrhoeae, NAA: NEGATIVE

## 2024-03-17 LAB — GONOCOCCUS CULTURE

## 2024-03-17 LAB — SYPHILIS SEROLOGY, ~~LOC~~ LAB
RPR, Quant: 1:8 {titer}
RPR: REACTIVE

## 2024-03-25 ENCOUNTER — Ambulatory Visit: Payer: Self-pay | Admitting: Family Medicine

## 2024-03-25 NOTE — Progress Notes (Signed)
 Reviewed positive syphilis results, (+)RPR, titer 1:8. Trep Ab not done with known previous positive on file with state lab. Unchanged titer from last check 05/06/2023. No evidence of new infection, this is likely his baseline. No treatment needed at this time.   Dorothyann Helling, MD 03/25/24  3:31 PM
